# Patient Record
Sex: Female | Born: 1960 | Race: Black or African American | Hispanic: No | Marital: Single | Smoking: Former smoker
Health system: Southern US, Community
[De-identification: ages and names within clinical notes are randomized; demographics above are authoritative.]

## PROBLEM LIST (undated history)

## (undated) DIAGNOSIS — I10 Essential (primary) hypertension: Secondary | ICD-10-CM

## (undated) HISTORY — PX: APPENDECTOMY: SHX54

## (undated) HISTORY — PX: TONSILLECTOMY: SUR1361

---

## 2012-10-07 DIAGNOSIS — M79606 Pain in leg, unspecified: Secondary | ICD-10-CM | POA: Insufficient documentation

## 2013-06-28 ENCOUNTER — Emergency Department: Payer: Self-pay | Admitting: Internal Medicine

## 2013-06-28 LAB — COMPREHENSIVE METABOLIC PANEL
AST: 29 U/L (ref 15–37)
Albumin: 3.6 g/dL (ref 3.4–5.0)
Alkaline Phosphatase: 45 U/L
Anion Gap: 7 (ref 7–16)
BILIRUBIN TOTAL: 1 mg/dL (ref 0.2–1.0)
BUN: 17 mg/dL (ref 7–18)
CALCIUM: 8.5 mg/dL (ref 8.5–10.1)
CREATININE: 1.03 mg/dL (ref 0.60–1.30)
Chloride: 107 mmol/L (ref 98–107)
Co2: 23 mmol/L (ref 21–32)
EGFR (Non-African Amer.): >60
Glucose: 129 mg/dL — ABNORMAL HIGH (ref 65–99)
OSMOLALITY: 277 (ref 275–301)
POTASSIUM: 3.6 mmol/L (ref 3.5–5.1)
SGPT (ALT): 32 U/L (ref 12–78)
Sodium: 137 mmol/L (ref 136–145)
Total Protein: 7.5 g/dL (ref 6.4–8.2)

## 2013-06-28 LAB — URINALYSIS, COMPLETE
BACTERIA: NONE SEEN
Bilirubin,UR: NEGATIVE
Glucose,UR: 50 mg/dL (ref 0–75)
Ketone: NEGATIVE
Leukocyte Esterase: NEGATIVE
NITRITE: NEGATIVE
Ph: 6 (ref 4.5–8.0)
Protein: NEGATIVE
RBC,UR: 95 /HPF (ref 0–5)
SPECIFIC GRAVITY: 1.018 (ref 1.003–1.030)
Squamous Epithelial: 1

## 2013-06-28 LAB — CBC WITH DIFFERENTIAL/PLATELET
BASOS ABS: 0.1 10*3/uL (ref 0.0–0.1)
BASOS PCT: 0.8 %
EOS ABS: 0.1 10*3/uL (ref 0.0–0.7)
EOS PCT: 1.1 %
HCT: 39.1 % (ref 35.0–47.0)
HGB: 12.7 g/dL (ref 12.0–16.0)
LYMPHS PCT: 32.5 %
Lymphocyte #: 2.6 10*3/uL (ref 1.0–3.6)
MCH: 29.8 pg (ref 26.0–34.0)
MCHC: 32.4 g/dL (ref 32.0–36.0)
MCV: 92 fL (ref 80–100)
Monocyte #: 0.5 x10 3/mm (ref 0.2–0.9)
Monocyte %: 6.3 %
NEUTROS ABS: 4.7 10*3/uL (ref 1.4–6.5)
NEUTROS PCT: 59.3 %
Platelet: 243 10*3/uL (ref 150–440)
RBC: 4.26 10*6/uL (ref 3.80–5.20)
RDW: 12.8 % (ref 11.5–14.5)
WBC: 7.9 10*3/uL (ref 3.6–11.0)

## 2013-06-28 LAB — HCG, QUANTITATIVE, PREGNANCY: Beta Hcg, Quant.: <1 m[IU]/mL — ABNORMAL LOW

## 2013-09-14 ENCOUNTER — Ambulatory Visit: Payer: Self-pay | Admitting: General Practice

## 2014-12-04 ENCOUNTER — Emergency Department: Payer: 59

## 2014-12-04 ENCOUNTER — Inpatient Hospital Stay
Admission: EM | Admit: 2014-12-04 | Discharge: 2014-12-05 | DRG: 103 | Disposition: A | Discharge status: Home / Self Care | Payer: 59 | Attending: Internal Medicine | Admitting: Internal Medicine

## 2014-12-04 ENCOUNTER — Inpatient Hospital Stay
Admit: 2014-12-04 | Discharge: 2014-12-04 | Disposition: A | Discharge status: Home / Self Care | Payer: 59 | Attending: Internal Medicine | Admitting: Internal Medicine

## 2014-12-04 ENCOUNTER — Inpatient Hospital Stay: Payer: 59

## 2014-12-04 DIAGNOSIS — Z9889 Other specified postprocedural states: Secondary | ICD-10-CM

## 2014-12-04 DIAGNOSIS — G43809 Other migraine, not intractable, without status migrainosus: Secondary | ICD-10-CM | POA: Diagnosis not present

## 2014-12-04 DIAGNOSIS — R51 Headache: Secondary | ICD-10-CM

## 2014-12-04 DIAGNOSIS — Z888 Allergy status to other drugs, medicaments and biological substances status: Secondary | ICD-10-CM | POA: Diagnosis not present

## 2014-12-04 DIAGNOSIS — E785 Hyperlipidemia, unspecified: Secondary | ICD-10-CM | POA: Diagnosis present

## 2014-12-04 DIAGNOSIS — Z79899 Other long term (current) drug therapy: Secondary | ICD-10-CM | POA: Diagnosis not present

## 2014-12-04 DIAGNOSIS — I10 Essential (primary) hypertension: Secondary | ICD-10-CM | POA: Diagnosis present

## 2014-12-04 DIAGNOSIS — Z87891 Personal history of nicotine dependence: Secondary | ICD-10-CM

## 2014-12-04 DIAGNOSIS — I639 Cerebral infarction, unspecified: Secondary | ICD-10-CM | POA: Diagnosis not present

## 2014-12-04 DIAGNOSIS — Z8249 Family history of ischemic heart disease and other diseases of the circulatory system: Secondary | ICD-10-CM

## 2014-12-04 DIAGNOSIS — R29898 Other symptoms and signs involving the musculoskeletal system: Secondary | ICD-10-CM

## 2014-12-04 DIAGNOSIS — Z9049 Acquired absence of other specified parts of digestive tract: Secondary | ICD-10-CM | POA: Diagnosis present

## 2014-12-04 DIAGNOSIS — R519 Headache, unspecified: Secondary | ICD-10-CM

## 2014-12-04 HISTORY — DX: Essential (primary) hypertension: I10

## 2014-12-04 LAB — COMPREHENSIVE METABOLIC PANEL
ALT: 37 U/L (ref 14–54)
ANION GAP: 7 (ref 5–15)
AST: 37 U/L (ref 15–41)
Albumin: 4.3 g/dL (ref 3.5–5.0)
Alkaline Phosphatase: 46 U/L (ref 38–126)
BILIRUBIN TOTAL: 1.2 mg/dL (ref 0.3–1.2)
BUN: 20 mg/dL (ref 6–20)
CO2: 28 mmol/L (ref 22–32)
Calcium: 9.4 mg/dL (ref 8.9–10.3)
Chloride: 102 mmol/L (ref 101–111)
Creatinine, Ser: 1.08 mg/dL — ABNORMAL HIGH (ref 0.44–1.00)
GFR calc Af Amer: >60 mL/min (ref >60)
GFR, EST NON AFRICAN AMERICAN: 58 mL/min — AB (ref >60)
Glucose, Bld: 112 mg/dL — ABNORMAL HIGH (ref 65–99)
POTASSIUM: 3.7 mmol/L (ref 3.5–5.1)
Sodium: 137 mmol/L (ref 135–145)
TOTAL PROTEIN: 7.6 g/dL (ref 6.5–8.1)

## 2014-12-04 LAB — URINALYSIS COMPLETE WITH MICROSCOPIC (ARMC ONLY)
BILIRUBIN URINE: NEGATIVE
Bacteria, UA: NONE SEEN
Glucose, UA: NEGATIVE mg/dL
KETONES UR: NEGATIVE mg/dL
LEUKOCYTES UA: NEGATIVE
Nitrite: NEGATIVE
PH: 6 (ref 5.0–8.0)
Protein, ur: NEGATIVE mg/dL
Specific Gravity, Urine: 1.004 — ABNORMAL LOW (ref 1.005–1.030)

## 2014-12-04 LAB — CBC
HEMATOCRIT: 37.6 % (ref 35.0–47.0)
HEMOGLOBIN: 12.4 g/dL (ref 12.0–16.0)
MCH: 29.7 pg (ref 26.0–34.0)
MCHC: 33 g/dL (ref 32.0–36.0)
MCV: 90 fL (ref 80.0–100.0)
Platelets: 194 10*3/uL (ref 150–440)
RBC: 4.17 MIL/uL (ref 3.80–5.20)
RDW: 13.1 % (ref 11.5–14.5)
WBC: 7.6 10*3/uL (ref 3.6–11.0)

## 2014-12-04 LAB — DIFFERENTIAL
Basophils Absolute: 0 10*3/uL (ref 0–0.1)
Basophils Relative: 1 %
EOS ABS: 0.1 10*3/uL (ref 0–0.7)
EOS PCT: 1 %
LYMPHS ABS: 2.7 10*3/uL (ref 1.0–3.6)
Lymphocytes Relative: 36 %
MONO ABS: 0.7 10*3/uL (ref 0.2–0.9)
MONOS PCT: 10 %
Neutro Abs: 4 10*3/uL (ref 1.4–6.5)
Neutrophils Relative %: 52 %

## 2014-12-04 LAB — APTT: aPTT: 26 seconds (ref 24–36)

## 2014-12-04 LAB — TROPONIN I

## 2014-12-04 LAB — PROTIME-INR
INR: 0.96
Prothrombin Time: 13 seconds (ref 11.4–15.0)

## 2014-12-04 LAB — LIPID PANEL
CHOL/HDL RATIO: 3.6 ratio
CHOLESTEROL: 207 mg/dL — AB (ref 0–200)
HDL: 57 mg/dL (ref >40)
LDL Cholesterol: 142 mg/dL — ABNORMAL HIGH (ref 0–99)
TRIGLYCERIDES: 39 mg/dL (ref <150)
VLDL: 8 mg/dL (ref 0–40)

## 2014-12-04 MED ORDER — ADULT MULTIVITAMIN W/MINERALS CH: 1.0000 | ORAL_TABLET | Freq: Every day | ORAL | Status: DC

## 2014-12-04 MED ORDER — ENOXAPARIN SODIUM 40 MG/0.4ML ~~LOC~~ SOLN
40.0000 mg | SUBCUTANEOUS | Status: DC
  Administered 2014-12-04: 20:00:00 40 mg via SUBCUTANEOUS
  Filled 2014-12-04: qty 0.4

## 2014-12-04 MED ORDER — SIMVASTATIN 20 MG PO TABS
20.0000 mg | ORAL_TABLET | Freq: Every day | ORAL | Status: DC
Start: 2014-12-04 — End: 2014-12-05
  Administered 2014-12-04: 20 mg via ORAL
  Filled 2014-12-04: qty 1

## 2014-12-04 MED ORDER — ACETAMINOPHEN 650 MG RE SUPP: 650.0000 mg | Freq: Four times a day (QID) | RECTAL | Status: DC | PRN

## 2014-12-04 MED ORDER — ASPIRIN 325 MG PO TABS
325.0000 mg | ORAL_TABLET | Freq: Every day | ORAL | Status: DC
  Filled 2014-12-04 (×2): qty 1

## 2014-12-04 MED ORDER — MULTI-VITAMIN/MINERALS PO TABS: 1.0000 | ORAL_TABLET | Freq: Every day | ORAL | Status: DC

## 2014-12-04 MED ORDER — ASPIRIN 81 MG PO CHEW
324.0000 mg | CHEWABLE_TABLET | Freq: Once | ORAL | Status: AC
  Administered 2014-12-04: 324 mg via ORAL
  Filled 2014-12-04: qty 4

## 2014-12-04 MED ORDER — ACETAMINOPHEN 500 MG PO TABS
1000.0000 mg | ORAL_TABLET | Freq: Once | ORAL | Status: AC
  Administered 2014-12-04: 1000 mg via ORAL

## 2014-12-04 MED ORDER — ONDANSETRON HCL 4 MG PO TABS: 4.0000 mg | ORAL_TABLET | Freq: Four times a day (QID) | ORAL | Status: DC | PRN

## 2014-12-04 MED ORDER — ONDANSETRON HCL 4 MG/2ML IJ SOLN: 4.0000 mg | Freq: Four times a day (QID) | INTRAMUSCULAR | Status: DC | PRN

## 2014-12-04 MED ORDER — SODIUM CHLORIDE 0.9 % IV SOLN
INTRAVENOUS | Status: DC
  Administered 2014-12-04: 17:00:00 via INTRAVENOUS

## 2014-12-04 MED ORDER — SENNOSIDES-DOCUSATE SODIUM 8.6-50 MG PO TABS: 1.0000 | ORAL_TABLET | Freq: Every evening | ORAL | Status: DC | PRN

## 2014-12-04 MED ORDER — ATENOLOL 25 MG PO TABS
25.0000 mg | ORAL_TABLET | Freq: Every day | ORAL | Status: DC
  Filled 2014-12-04: qty 1

## 2014-12-04 MED ORDER — BUTALBITAL-APAP-CAFFEINE 50-325-40 MG PO TABS
1.0000 | ORAL_TABLET | ORAL | Status: DC | PRN
  Administered 2014-12-04: 1 via ORAL
  Filled 2014-12-04: qty 1

## 2014-12-04 MED ORDER — ACETAMINOPHEN 500 MG PO TABS
ORAL_TABLET | ORAL | Status: AC
  Administered 2014-12-04: 1000 mg via ORAL
  Filled 2014-12-04: qty 2

## 2014-12-04 MED ORDER — TRAMADOL HCL 50 MG PO TABS: 50.0000 mg | ORAL_TABLET | Freq: Two times a day (BID) | ORAL | Status: DC | PRN

## 2014-12-04 MED ORDER — ALUM & MAG HYDROXIDE-SIMETH 200-200-20 MG/5ML PO SUSP: 30.0000 mL | Freq: Four times a day (QID) | ORAL | Status: DC | PRN

## 2014-12-04 MED ORDER — METOCLOPRAMIDE HCL 5 MG/ML IJ SOLN
10.0000 mg | Freq: Once | INTRAMUSCULAR | Status: AC
Start: 2014-12-04 — End: 2014-12-04
  Administered 2014-12-04: 10 mg via INTRAVENOUS
  Filled 2014-12-04: qty 2

## 2014-12-04 MED ORDER — ACETAMINOPHEN 325 MG PO TABS: 650.0000 mg | ORAL_TABLET | Freq: Four times a day (QID) | ORAL | Status: DC | PRN

## 2014-12-04 NOTE — ED Provider Notes (Signed)
[facility] Emergency Department Provider Note  ____________________________________________  Time seen: 14  I have reviewed the triage vital signs and the nursing notes.   HISTORY  Chief Complaint Shortness of Breath  headache "Dragging left leg"    HPI Mariah Christian is a 54 y.o. female who worked overnight at a nursing care facility. She reports she was doing well yesterday. She woke up in the morning and went for her usual 3 mile walk. Later in the day she began to develop a headache. She took ibuprofen at around 5 PM and then prepared going to work.  At work, she continued to have a headache. She noticed that it would worsen with change in position. She specifically reports it hurt when she sat down and when she stood up. It also hurt if she bent over. She reports the headache was steady, not throbbing.  She had nausea with this. The nausea waxed and waned.   At 5 AM, she was up doing medication rounds. She noticed the left leg felt like it wasn't cooperating very well, as though it was "dragging".    The symptoms have continued through 6 AM. She started feeling worse and worse. She called one of the nurses who took her blood pressure. The blood pressure was okay but her heart rate was elevated at 126 while she was seated.  Family member in the room reports they noticed yesterday that the patient's heart rate, as measured by her fit that, was little high when she was resting and sitting. It was 102.  Patient denies having had symptoms like this before. She does describe some "shortness of breath", but is not having any difficulty breathing and denies any wheezing.        Past Medical History  Diagnosis Date  . Hypertension     There are no active problems to display for this patient.   Past Surgical History  Procedure Laterality Date  . Appendectomy    . Tonsillectomy      Current Outpatient Rx  Name  Route  Sig  Dispense  Refill   . atenolol (TENORMIN) 25 MG tablet   Oral   Take 1 tablet by mouth daily.         . fluticasone (FLONASE) 50 MCG/ACT nasal spray   Nasal   Place 2 sprays into the nose daily.         Marland Kitchen gabapentin (NEURONTIN) 300 MG capsule   Oral   Take 1 capsule by mouth daily as needed.         . Multiple Vitamins-Minerals (MULTIVITAMIN WITH MINERALS) tablet   Oral   Take 1 tablet by mouth daily.           Allergies Doxycycline  No family history on file.  Social History Social History  Substance Use Topics  . Smoking status: Former Research scientist (life sciences)  . Smokeless tobacco: Never Used  . Alcohol Use: Yes     Comment: occassionally    Review of Systems  Constitutional: Negative for fever. ENT: Negative for sore throat. Cardiovascular: Negative for chest pain. Respiratory: Negative for shortness of breath. Gastrointestinal: Negative for abdominal pain and diarrhea. Positive for nausea. Genitourinary: Negative for dysuria. Musculoskeletal: No myalgias or injuries. Skin: Negative for rash. Neurological: Positive for headaches. Notable for "left leg dragging"   10-point ROS otherwise negative.  ____________________________________________   PHYSICAL EXAM:  VITAL SIGNS: ED Triage Vitals  Enc Vitals Group     BP 12/04/14 0718 156/95 mmHg  Pulse Rate 12/04/14 0718 89     Resp 12/04/14 0718 17     Temp 12/04/14 0718 97.7 F (36.5 C)     Temp Source 12/04/14 0718 Oral     SpO2 12/04/14 0718 100 %     Weight 12/04/14 0718 180 lb (81.647 kg)     Height 12/04/14 0718 5\' 8"  (1.727 m)     Head Cir --      Peak Flow --      Pain Score 12/04/14 0720 6     Pain Loc --      Pain Edu? --      Excl. in [place]? --     Constitutional:  Alert and oriented. Well appearing and in no distress. ENT   Head: Normocephalic and atraumatic.   Nose: No congestion/rhinnorhea.   Mouth/Throat: Mucous membranes are moist. Cardiovascular: Normal rate at 92, regular rhythm, no murmur  noted Respiratory:  Normal respiratory effort, no tachypnea.    Breath sounds are clear and equal bilaterally.  Gastrointestinal: Soft and nontender. No distention.  Back: No muscle spasm, no tenderness, no CVA tenderness. Musculoskeletal: No deformity noted. Nontender with normal range of motion in all extremities.  No noted edema. Neurologic:  Normal speech and language. Grip strength is equal, though I expected a firmer grip from this strong appearing 54 year old female. No pronator drift. Good finger to nose, although the motion with the left hand and arm is a little bit slower and much more deliberate. 3-4 over 5 strength in the left leg and 4 over 5 strength in the left arm. The patient reports sensation is different in the left leg than the right leg. She reports it feels more tingly on the left. Skin:  Skin is warm, dry. No rash noted. Psychiatric: Mood and affect are normal. Speech and behavior are normal.  ____________________________________________    LABS (pertinent positives/negatives)  Labs Reviewed  COMPREHENSIVE METABOLIC PANEL - Abnormal; Notable for the following:    Glucose, Bld 112 (*)    Creatinine, Ser 1.08 (*)    GFR calc non Af Amer 58 (*)    All other components within normal limits  CBC  DIFFERENTIAL  TROPONIN I  PROTIME-INR  APTT  URINALYSIS COMPLETEWITH MICROSCOPIC ([facility] ONLY)     ____________________________________________   EKG  ED ECG REPORT I, Jarmal Lewelling W, the attending physician, personally viewed and interpreted this ECG.   Date: 12/04/2014  EKG Time: 7:47 AM  Rate: 110  Rhythm: Sinus tachycardia  Axis: Normal  Intervals: Normal  ST&T Change: None noted   ____________________________________________    RADIOLOGY  CT head  FINDINGS: The ventricles are normal in size and configuration. There is no intracranial mass hemorrhage, extra-axial fluid collection, or midline shift. Gray-white compartments are normal. No acute  infarct evident. Bony calvarium appears intact. The mastoid air cells are clear.  IMPRESSION: Study within normal limits. ____________________________________________   PROCEDURES  CRITICAL CARE Performed by: Ahmed Prima   Total critical care time: 30 minutes  Critical care time was exclusive of separately billable procedures and treating other patients.  Critical care was necessary to treat or prevent imminent or life-threatening deterioration.  Critical care was time spent personally by me on the following activities: development of treatment plan with patient and/or surrogate as well as nursing, discussions with consultants, evaluation of patient's response to treatment, examination of patient, obtaining history from patient or surrogate, ordering and performing treatments and interventions, ordering and review of laboratory studies, ordering and review of  radiographic studies, pulse oximetry and re-evaluation of patient's condition.   ____________________________________________   INITIAL IMPRESSION / ASSESSMENT AND PLAN / ED COURSE  Pertinent labs & imaging results that were available during my care of the patient were reviewed by me and considered in my medical decision making (see chart for details).  Initially, the patient was focused on what appeared to be a mild headache and some other mild symptoms. She looks well and appears well. She has notable weakness in the left leg on exam. I suspect the patient is having a CVA. We'll obtain a CT scan of the head and appropriate lab work.  ----------------------------------------- 9:19 AM on 12/04/2014 -----------------------------------------  CT head is negative. We will treat with aspirin at this time.  Reassessment of the patient finds her with slight improvement regarding the headache but ongoing weakness in the left leg. She has had additional episodes of feeling short of breath with a higher heart rate. Currently  her heart rate is in the 90s.  We will seek admission through the hospitalist service for this new onset focal weakness. This was discussed with Dr. Benjie Karvonen.   ____________________________________________   FINAL CLINICAL IMPRESSION(S) / ED DIAGNOSES  Final diagnoses:  Acute CVA (cerebrovascular accident)  Left leg weakness  Acute nonintractable headache, unspecified headache type    bv   Ahmed Prima, MD 12/04/14 [phone]

## 2014-12-04 NOTE — Progress Notes (Signed)
PT Cancellation Note  Patient Details Name: Mariah Christian MRN: [id number] DOB: 06-06-1960   Cancelled Treatment:    Reason Eval/Treat Not Completed: Patient at procedure or test/unavailable (Consult received and chart reviewed.  Patient currently off unit for diagnostic testing. Will re-attempt at later time/date as patient available and medically appropriate.)   Cecelia Graciano H. Owens Shark, PT, DPT, NCS 12/04/2014, 2:52 PM [phone]

## 2014-12-04 NOTE — Progress Notes (Signed)
*  PRELIMINARY RESULTS* Echocardiogram 2D Echocardiogram has been performed.  Mariah Christian 12/04/2014, 2:37 PM

## 2014-12-04 NOTE — ED Notes (Signed)
Pt taken to Echo.

## 2014-12-04 NOTE — ED Notes (Signed)
Pt comes into the ED via EMS from work at [employer], states before her shift last she had HA, states it progressed over the night and around 6am had nausea, diaphoresis and tightness in chest.

## 2014-12-04 NOTE — H&P (Signed)
[place] [facility] [place] NAME: Mariah Christian    MR#:  [id number]  DATE OF BIRTH:  11-06-1960  DATE OF ADMISSION:  12/04/2014  PRIMARY CARE PHYSICIAN: [facility]  REQUESTING/REFERRING PHYSICIAN: Dr Thomasene Lot  CHIEF COMPLAINT:  Tachycardia and weakness on the left side HISTORY OF PRESENT ILLNESS:  Mariah Christian  is a 54 y.o. female with a known history of hypertension who presents with above complaint. Patient has been in her usual state of health. Yesterday she woke up with a very severe headache. She took pain medications including ibuprofen and throughout the evening a continued on. She went to work and continued to have a headache however slightly improved. She noticed that the headache worsened with position. She reports the headache was not throbbing. At a proximal May 5 AM while she was doing her medication around she felt weakness on her left side, nauseous and her heart rate checked which was 126 and her blood pressure was normal. She presented to the emergency room with weakness on the left side.  PAST MEDICAL HISTORY:   Past Medical History  Diagnosis Date  . Hypertension     PAST SURGICAL HISTORY:   Past Surgical History  Procedure Laterality Date  . Appendectomy    . Tonsillectomy      SOCIAL HISTORY:   Social History  Substance Use Topics  . Smoking status: Former Research scientist (life sciences)  . Smokeless tobacco: Never Used  . Alcohol Use: Yes     Comment: occassionally    FAMILY HISTORY:  Hypertension  DRUG ALLERGIES:   Allergies  Allergen Reactions  . Doxycycline Shortness Of Breath     REVIEW OF SYSTEMS:  CONSTITUTIONAL: No fever, fatigue +++weakness.  EYES: No blurred or double vision.  EARS, NOSE, AND THROAT: No tinnitus or ear pain.  RESPIRATORY: No cough, shortness of breath, wheezing or hemoptysis.  CARDIOVASCULAR: No chest pain, orthopnea, edema.  GASTROINTESTINAL: No nausea, vomiting, diarrhea or abdominal pain.   GENITOURINARY: No dysuria, hematuria.  ENDOCRINE: No polyuria, nocturia,  HEMATOLOGY: No anemia, easy bruising or bleeding SKIN: No rash or lesion. MUSCULOSKELETAL: No joint pain or arthritis.   NEUROLOGIC: No tingling, numbness, +++weakness. ++Headache PSYCHIATRY: No anxiety or depression.   MEDICATIONS AT HOME:   Prior to Admission medications   Medication Sig Start Date End Date Taking? Authorizing Provider  atenolol (TENORMIN) 25 MG tablet Take 1 tablet by mouth daily. 12/26/13  Yes Historical Provider, MD  fluticasone (FLONASE) 50 MCG/ACT nasal spray Place 2 sprays into the nose daily. 10/22/14 10/22/15 Yes Historical Provider, MD  gabapentin (NEURONTIN) 300 MG capsule Take 1 capsule by mouth daily as needed. 12/26/13  Yes Historical Provider, MD  Multiple Vitamins-Minerals (MULTIVITAMIN WITH MINERALS) tablet Take 1 tablet by mouth daily.   Yes Historical Provider, MD      VITAL SIGNS:  Blood pressure 134/79, pulse 92, temperature 97.7 F (36.5 C), temperature source Oral, resp. rate 18, height 5\' 8"  (1.727 m), weight 81.647 kg (180 lb), SpO2 99 %.  PHYSICAL EXAMINATION:  GENERAL:  54 y.o.-year-old patient lying in the bed with no acute distress.  EYES: Pupils equal, round, reactive to light and accommodation. No scleral icterus. Extraocular muscles intact.  HEENT: Head atraumatic, normocephalic. Oropharynx and nasopharynx clear.  NECK:  Supple, no jugular venous distention. No thyroid enlargement, no tenderness.  LUNGS: Normal breath sounds bilaterally, no wheezing, rales,rhonchi or crepitation. No use of accessory muscles of respiration.  CARDIOVASCULAR: S1, S2 normal. No murmurs, rubs,  or gallops.  ABDOMEN: Soft, nontender, nondistended. Bowel sounds present. No organomegaly or mass.  EXTREMITIES: No pedal edema, cyanosis, or clubbing.  NEUROLOGIC: Cranial nerves II through XII are grossly intact. 4+/5 LLE PSYCHIATRIC: The patient is alert and oriented x 3.  SKIN: No obvious  rash, lesion, or ulcer.   LABORATORY PANEL:   CBC  Recent Labs Lab 12/04/14 0723  WBC 7.6  HGB 12.4  HCT 37.6  PLT 194   ------------------------------------------------------------------------------------------------------------------  Chemistries   Recent Labs Lab 12/04/14 0723  NA 137  K 3.7  CL 102  CO2 28  GLUCOSE 112*  BUN 20  CREATININE 1.08*  CALCIUM 9.4  AST 37  ALT 37  ALKPHOS 46  BILITOT 1.2   ------------------------------------------------------------------------------------------------------------------  Cardiac Enzymes  Recent Labs Lab 12/04/14 0723  TROPONINI <0.03   ------------------------------------------------------------------------------------------------------------------  RADIOLOGY:  Ct Head Wo Contrast  12/04/2014   CLINICAL DATA:  Left-sided weakness for several hours; headache  EXAM: CT HEAD WITHOUT CONTRAST  TECHNIQUE: Contiguous axial images were obtained from the base of the skull through the vertex without intravenous contrast.  COMPARISON:  None.  FINDINGS: The ventricles are normal in size and configuration. There is no intracranial mass hemorrhage, extra-axial fluid collection, or midline shift. Gray-white compartments are normal. No acute infarct evident. Bony calvarium appears intact. The mastoid air cells are clear.  IMPRESSION: Study within normal limits.   Electronically Signed   By: Lowella Grip III M.D.   On: 12/04/2014 08:12   US Carotid Bilateral  12/04/2014   CLINICAL DATA:  CVA. Severe headache. Left-sided weakness. Dizziness.  EXAM: BILATERAL CAROTID DUPLEX ULTRASOUND  TECHNIQUE: Pearline Cables scale imaging, color Doppler and duplex ultrasound were performed of bilateral carotid and vertebral arteries in the neck.  COMPARISON:  None.  FINDINGS: Criteria: Quantification of carotid stenosis is based on velocity parameters that correlate the residual internal carotid diameter with NASCET-based stenosis levels, using the diameter  of the distal internal carotid lumen as the denominator for stenosis measurement.  The following velocity measurements were obtained:  RIGHT  ICA:  91/38 cm/sec  CCA:  846/96 cm/sec  SYSTOLIC ICA/CCA RATIO:  0.8  DIASTOLIC ICA/CCA RATIO:  1.3  ECA:  97 cm/sec  LEFT  ICA:  125/41 cm/sec  CCA:  295/28 cm/sec  SYSTOLIC ICA/CCA RATIO:  1.0  DIASTOLIC ICA/CCA RATIO:  1.3  ECA:  139 cm/sec  RIGHT CAROTID ARTERY: Mild plaque right carotid bifurcation. No flow limiting stenosis.  RIGHT VERTEBRAL ARTERY:  Patent with antegrade flow.  LEFT CAROTID ARTERY: Mild plaque left carotid bifurcation. No flow limiting stenosis.  LEFT VERTEBRAL ARTERY:  Patent with antegrade flow.  IMPRESSION: 1. Mild bilateral carotid bifurcation atherosclerotic vascular plaque. No flow limiting stenosis. Degree of stenosis less than 50%. 2. Vertebral arteries are patent with antegrade flow.   Electronically Signed   By: Marcello Moores  Register   On: 12/04/2014 12:37    EKG:  No sinus rhythm no ST elevation depression  IMPRESSION AND PLAN:  54 year old female who has a history of hypertension who presents with elevated heart rate and left-sided weakness.  1. Left-sided weakness: Patient symptoms may be consistent with atypical migraine versus CVA. Patient will undergo stroke workup including MRI, carotid Dopplers and 2-D echocardiogram. I will also consult neurology. If she continues to have a headache I will try fierce that.  2. Headache: This is not a thunderclap headache. Headache is improving. Patient may have had a typical migraine. Neurology has been consulted.  3. Essential hypertension: Continue  atenolol.   All the records are reviewed and case discussed with ED provider. Management plans discussed with the patient and she is in agreement.  CODE STATUS: FULL  TOTAL TIME TAKING CARE OF THIS PATIENT: 45 minutes.    Francis Doenges M.D on 12/04/2014 at 2:00 PM  Between 7am to 6pm - Pager - [phone] After 6pm go to [email or web address]  - password [email or web address]  [facility]  Office  [phone]  CC: Primary care physician; No primary care provider on file.

## 2014-12-04 NOTE — ED Notes (Signed)
Patient is resting comfortably. 

## 2014-12-04 NOTE — Plan of Care (Signed)
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 CT and MRI negative for any infarcts. Awaiting a neurologist to see pt to assess what maybe causing this headache.  Goals to discharge: Pt consult Neurology consult NIH and stroke assessment q4h Assess bp for any spikes or trends LDL elevated, needs a statin at discharge Educate on a heart healthy diet, lifestyle change.

## 2014-12-05 MED ORDER — BUTALBITAL-APAP-CAFFEINE 50-325-40 MG PO TABS: 1.0000 | ORAL_TABLET | Freq: Four times a day (QID) | ORAL | Status: AC | PRN

## 2014-12-05 NOTE — Progress Notes (Signed)
Discharge and prescription reviewed with pt states understanding, no noted complaints at discharge

## 2014-12-05 NOTE — Discharge Instructions (Signed)
General Headache Without Cause A general headache is pain or discomfort felt around the head or neck area. The cause may not be found.  HOME CARE   Keep all doctor visits.  Only take medicines as told by your doctor.  Lie down in a dark, quiet room when you have a headache.  Keep a journal to find out if certain things bring on headaches. For example, write down:  What you eat and drink.  How much sleep you get.  Any change to your diet or medicines.  Relax by getting a massage or doing other relaxing activities.  Put ice or heat packs on the head and neck area as told by your doctor.  Lessen stress.  Sit up straight. Do not tighten (tense) your muscles.  Quit smoking if you smoke.  Lessen how much alcohol you drink.  Lessen how much caffeine you drink, or stop drinking caffeine.  Eat and sleep on a regular schedule.  Get 7 to 9 hours of sleep, or as told by your doctor.  Keep lights dim if bright lights bother you or make your headaches worse. GET HELP RIGHT AWAY IF:   Your headache becomes really bad.  You have a fever.  You have a stiff neck.  You have trouble seeing.  Your muscles are weak, or you lose muscle control.  You lose your balance or have trouble walking.  You feel like you will pass out (faint), or you pass out.  You have really bad symptoms that are different than your first symptoms.  You have problems with the medicines given to you by your doctor.  Your medicines do not work.  Your headache feels different than the other headaches.  You feel sick to your stomach (nauseous) or throw up (vomit). MAKE SURE YOU:   Understand these instructions.  Will watch your condition.  Will get help right away if you are not doing well or get worse. Document Released: 12/24/2007 Document Revised: 06/08/2011 Document Reviewed: 03/06/2011 [facility] Patient Information 2015 [place], [place]. This information is not intended to replace advice given to  you by your health care provider. Make sure you discuss any questions you have with your health care provider.  Migraine Headache A migraine headache is very bad, throbbing pain on one or both sides of your head. Talk to your doctor about what things may bring on (trigger) your migraine headaches. HOME CARE  Only take medicines as told by your doctor.  Lie down in a dark, quiet room when you have a migraine.  Keep a journal to find out if certain things bring on migraine headaches. For example, write down:  What you eat and drink.  How much sleep you get.  Any change to your diet or medicines.  Lessen how much alcohol you drink.  Quit smoking if you smoke.  Get enough sleep.  Lessen any stress in your life.  Keep lights dim if bright lights bother you or make your migraines worse. GET HELP RIGHT AWAY IF:   Your migraine becomes really bad.  You have a fever.  You have a stiff neck.  You have trouble seeing.  Your muscles are weak, or you lose muscle control.  You lose your balance or have trouble walking.  You feel like you will pass out (faint), or you pass out.  You have really bad symptoms that are different than your first symptoms. MAKE SURE YOU:   Understand these instructions.  Will watch your condition.  Will  get help right away if you are not doing well or get worse. Document Released: 12/24/2007 Document Revised: 06/08/2011 Document Reviewed: 11/21/2012 [facility] Patient Information 2015 [place], [place]. This information is not intended to replace advice given to you by your health care provider. Make sure you discuss any questions you have with your health care provider.  Headaches, Frequently Asked Questions MIGRAINE HEADACHES Q: What is migraine? What causes it? How can I treat it? A: Generally, migraine headaches begin as a dull ache. Then they develop into a constant, throbbing, and pulsating pain. You may experience pain at the temples. You may  experience pain at the front or back of one or both sides of the head. The pain is usually accompanied by a combination of:  Nausea.  Vomiting.  Sensitivity to light and noise. Some people (about 15%) experience an aura (see below) before an attack. The cause of migraine is believed to be chemical reactions in the brain. Treatment for migraine may include over-the-counter or prescription medications. It may also include self-help techniques. These include relaxation training and biofeedback.  Q: What is an aura? A: About 15% of people with migraine get an "aura". This is a sign of neurological symptoms that occur before a migraine headache. You may see wavy or jagged lines, dots, or flashing lights. You might experience tunnel vision or blind spots in one or both eyes. The aura can include visual or auditory hallucinations (something imagined). It may include disruptions in smell (such as strange odors), taste or touch. Other symptoms include:  Numbness.  A "pins and needles" sensation.  Difficulty in recalling or speaking the correct word. These neurological events may last as long as 60 minutes. These symptoms will fade as the headache begins. Q: What is a trigger? A: Certain physical or environmental factors can lead to or "trigger" a migraine. These include:  Foods.  Hormonal changes.  Weather.  Stress. It is important to remember that triggers are different for everyone. To help prevent migraine attacks, you need to figure out which triggers affect you. Keep a headache diary. This is a good way to track triggers. The diary will help you talk to your healthcare professional about your condition. Q: Does weather affect migraines? A: Bright sunshine, hot, humid conditions, and drastic changes in barometric pressure may lead to, or "trigger," a migraine attack in some people. But studies have shown that weather does not act as a trigger for everyone with migraines. Q: What is the link  between migraine and hormones? A: Hormones start and regulate many of your body's functions. Hormones keep your body in balance within a constantly changing environment. The levels of hormones in your body are unbalanced at times. Examples are during menstruation, pregnancy, or menopause. That can lead to a migraine attack. In fact, about three quarters of all women with migraine report that their attacks are related to the menstrual cycle.  Q: Is there an increased risk of stroke for migraine sufferers? A: The likelihood of a migraine attack causing a stroke is very remote. That is not to say that migraine sufferers cannot have a stroke associated with their migraines. In persons under age 53, the most common associated factor for stroke is migraine headache. But over the course of a person's normal life span, the occurrence of migraine headache may actually be associated with a reduced risk of dying from cerebrovascular disease due to stroke.  Q: What are acute medications for migraine? A: Acute medications are used to treat  the pain of the headache after it has started. Examples over-the-counter medications, NSAIDs, ergots, and triptans.  Q: What are the triptans? A: Triptans are the newest class of abortive medications. They are specifically targeted to treat migraine. Triptans are vasoconstrictors. They moderate some chemical reactions in the brain. The triptans work on receptors in your brain. Triptans help to restore the balance of a neurotransmitter called serotonin. Fluctuations in levels of serotonin are thought to be a main cause of migraine.  Q: Are over-the-counter medications for migraine effective? A: Over-the-counter, or "OTC," medications may be effective in relieving mild to moderate pain and associated symptoms of migraine. But you should see your caregiver before beginning any treatment regimen for migraine.  Q: What are preventive medications for migraine? A: Preventive medications  for migraine are sometimes referred to as "prophylactic" treatments. They are used to reduce the frequency, severity, and length of migraine attacks. Examples of preventive medications include antiepileptic medications, antidepressants, beta-blockers, calcium channel blockers, and NSAIDs (nonsteroidal anti-inflammatory drugs). Q: Why are anticonvulsants used to treat migraine? A: During the past few years, there has been an increased interest in antiepileptic drugs for the prevention of migraine. They are sometimes referred to as "anticonvulsants". Both epilepsy and migraine may be caused by similar reactions in the brain.  Q: Why are antidepressants used to treat migraine? A: Antidepressants are typically used to treat people with depression. They may reduce migraine frequency by regulating chemical levels, such as serotonin, in the brain.  Q: What alternative therapies are used to treat migraine? A: The term "alternative therapies" is often used to describe treatments considered outside the scope of conventional Western medicine. Examples of alternative therapy include acupuncture, acupressure, and yoga. Another common alternative treatment is herbal therapy. Some herbs are believed to relieve headache pain. Always discuss alternative therapies with your caregiver before proceeding. Some herbal products contain arsenic and other toxins. TENSION HEADACHES Q: What is a tension-type headache? What causes it? How can I treat it? A: Tension-type headaches occur randomly. They are often the result of temporary stress, anxiety, fatigue, or anger. Symptoms include soreness in your temples, a tightening band-like sensation around your head (a "vice-like" ache). Symptoms can also include a pulling feeling, pressure sensations, and contracting head and neck muscles. The headache begins in your forehead, temples, or the back of your head and neck. Treatment for tension-type headache may include over-the-counter or  prescription medications. Treatment may also include self-help techniques such as relaxation training and biofeedback. CLUSTER HEADACHES Q: What is a cluster headache? What causes it? How can I treat it? A: Cluster headache gets its name because the attacks come in groups. The pain arrives with little, if any, warning. It is usually on one side of the head. A tearing or bloodshot eye and a runny nose on the same side of the headache may also accompany the pain. Cluster headaches are believed to be caused by chemical reactions in the brain. They have been described as the most severe and intense of any headache type. Treatment for cluster headache includes prescription medication and oxygen. SINUS HEADACHES Q: What is a sinus headache? What causes it? How can I treat it? A: When a cavity in the bones of the face and skull (a sinus) becomes inflamed, the inflammation will cause localized pain. This condition is usually the result of an allergic reaction, a tumor, or an infection. If your headache is caused by a sinus blockage, such as an infection, you will probably have a  fever. An x-ray will confirm a sinus blockage. Your caregiver's treatment might include antibiotics for the infection, as well as antihistamines or decongestants.  REBOUND HEADACHES Q: What is a rebound headache? What causes it? How can I treat it? A: A pattern of taking acute headache medications too often can lead to a condition known as "rebound headache." A pattern of taking too much headache medication includes taking it more than 2 days per week or in excessive amounts. That means more than the label or a caregiver advises. With rebound headaches, your medications not only stop relieving pain, they actually begin to cause headaches. Doctors treat rebound headache by tapering the medication that is being overused. Sometimes your caregiver will gradually substitute a different type of treatment or medication. Stopping may be a  challenge. Regularly overusing a medication increases the potential for serious side effects. Consult a caregiver if you regularly use headache medications more than 2 days per week or more than the label advises. ADDITIONAL QUESTIONS AND ANSWERS Q: What is biofeedback? A: Biofeedback is a self-help treatment. Biofeedback uses special equipment to monitor your body's involuntary physical responses. Biofeedback monitors:  Breathing.  Pulse.  Heart rate.  Temperature.  Muscle tension.  Brain activity. Biofeedback helps you refine and perfect your relaxation exercises. You learn to control the physical responses that are related to stress. Once the technique has been mastered, you do not need the equipment any more. Q: Are headaches hereditary? A: Four out of five (80%) of people that suffer report a family history of migraine. Scientists are not sure if this is genetic or a family predisposition. Despite the uncertainty, a child has a 50% chance of having migraine if one parent suffers. The child has a 75% chance if both parents suffer.  Q: Can children get headaches? A: By the time they reach high school, most young people have experienced some type of headache. Many safe and effective approaches or medications can prevent a headache from occurring or stop it after it has begun.  Q: What type of doctor should I see to diagnose and treat my headache? A: Start with your primary caregiver. Discuss his or her experience and approach to headaches. Discuss methods of classification, diagnosis, and treatment. Your caregiver may decide to recommend you to a headache specialist, depending upon your symptoms or other physical conditions. Having diabetes, allergies, etc., may require a more comprehensive and inclusive approach to your headache. The National Headache Foundation will provide, upon request, a list of [facility] physician members in your state. Document Released: 06/06/2003 Document Revised:  06/08/2011 Document Reviewed: 11/14/2007 [facility] Patient Information 2015 [place], [place]. This information is not intended to replace advice given to you by your health care provider. Make sure you discuss any questions you have with your health care provider.

## 2014-12-05 NOTE — Plan of Care (Signed)
Problem: Discharge/Transitional Outcomes Goal: Other Discharge Outcomes/Goals Outcome: Progressing Plan of care progress to goal: VSS Pt up ad lib Complains of occasional headache

## 2014-12-05 NOTE — Discharge Summary (Signed)
[place] [facility] [place] NAME: Mariah Christian    MR#:  [id number]  DATE OF BIRTH:  [birth year]  DATE OF ADMISSION:  12/04/2014 ADMITTING PHYSICIAN: Bettey Costa, MD  DATE OF DISCHARGE:12/05/2014  PRIMARY CARE PHYSICIAN: [facility]   ADMISSION DIAGNOSIS:  CVA (cerebral infarction) [I63.9] Left leg weakness [R29.898] Acute CVA (cerebrovascular accident) [I63.9] Acute nonintractable headache, unspecified headache type [R51]  DISCHARGE DIAGNOSIS:  Active Problems:   Stroke   SECONDARY DIAGNOSIS:   Past Medical History  Diagnosis Date  . Hypertension     HOSPITAL COURSE:      DISCHARGE CONDITIONS AND DIET:  This is a very pleasant 54 year old female with a history of hypertension who presented with headache and weakness of her left side.  1. Left-sided weakness with headache: This is secondary to atypical migraine. She underwent stroke workup which essentially was negative. Fioricet did alleviate her headache and symptoms.  2. Essential hypertension: Patient will continue on atenolol.  3. Hyperlipidemia: Patient's LDL was elevated. She will need to speak with her primary care physician regarding this. She is encouraged to continue exercise.  CONSULTS OBTAINED:    DRUG ALLERGIES:   Allergies  Allergen Reactions  . Doxycycline Shortness Of Breath    DISCHARGE MEDICATIONS:   Current Discharge Medication List    START taking these medications   Details  butalbital-acetaminophen-caffeine (FIORICET) 50-325-40 MG per tablet Take 1-2 tablets by mouth every 6 (six) hours as needed for headache. Qty: 20 tablet, Refills: 0      CONTINUE these medications which have NOT CHANGED   Details  atenolol (TENORMIN) 25 MG tablet Take 1 tablet by mouth daily.    fluticasone (FLONASE) 50 MCG/ACT nasal spray Place 2 sprays into the nose daily.    gabapentin (NEURONTIN) 300 MG capsule Take 1 capsule by mouth daily as needed.    Multiple  Vitamins-Minerals (MULTIVITAMIN WITH MINERALS) tablet Take 1 tablet by mouth daily.              Today   CHIEF COMPLAINT:   patient is doing well this morning. Patient has no complaints.    VITAL SIGNS:  Blood pressure 136/83, pulse 110, temperature 97.8 F (36.6 C), temperature source Oral, resp. rate 16, height 5\' 8"  (1.727 m), weight 81.647 kg (180 lb), SpO2 98 %.   REVIEW OF SYSTEMS:  Review of Systems  Constitutional: Negative for fever, chills and malaise/fatigue.  HENT: Negative for sore throat.   Eyes: Negative for blurred vision.  Respiratory: Negative for cough, hemoptysis, shortness of breath and wheezing.   Cardiovascular: Negative for chest pain, palpitations and leg swelling.  Gastrointestinal: Negative for nausea, vomiting, abdominal pain, diarrhea and blood in stool.  Genitourinary: Negative for dysuria.  Musculoskeletal: Negative for back pain.  Neurological: Negative for dizziness, tremors, sensory change, speech change, focal weakness and headaches.  Endo/Heme/Allergies: Does not bruise/bleed easily.     PHYSICAL EXAMINATION:  GENERAL:  54 y.o.-year-old patient lying in the bed with no acute distress.  NECK:  Supple, no jugular venous distention. No thyroid enlargement, no tenderness.  LUNGS: Normal breath sounds bilaterally, no wheezing, rales,rhonchi  No use of accessory muscles of respiration.  CARDIOVASCULAR: S1, S2 normal. No murmurs, rubs, or gallops.  ABDOMEN: Soft, non-tender, non-distended. Bowel sounds present. No organomegaly or mass.  EXTREMITIES: No pedal edema, cyanosis, or clubbing.  PSYCHIATRIC: The patient is alert and oriented x 3.  SKIN: No obvious rash, lesion, or ulcer.   DATA REVIEW:  CBC  Recent Labs Lab 12/04/14 0723  WBC 7.6  HGB 12.4  HCT 37.6  PLT 194    Chemistries   Recent Labs Lab 12/04/14 0723  NA 137  K 3.7  CL 102  CO2 28  GLUCOSE 112*  BUN 20  CREATININE 1.08*  CALCIUM 9.4  AST 37  ALT 37   ALKPHOS 46  BILITOT 1.2    Cardiac Enzymes  Recent Labs Lab 12/04/14 0723  TROPONINI <0.03    Microbiology Results  @[email or web address] @  RADIOLOGY:  Ct Head Wo Contrast  12/04/2014   CLINICAL DATA:  Left-sided weakness for several hours; headache  EXAM: CT HEAD WITHOUT CONTRAST  TECHNIQUE: Contiguous axial images were obtained from the base of the skull through the vertex without intravenous contrast.  COMPARISON:  None.  FINDINGS: The ventricles are normal in size and configuration. There is no intracranial mass hemorrhage, extra-axial fluid collection, or midline shift. Gray-white compartments are normal. No acute infarct evident. Bony calvarium appears intact. The mastoid air cells are clear.  IMPRESSION: Study within normal limits.   Electronically Signed   By: Lowella Grip III M.D.   On: 12/04/2014 08:12   Mr Brain Wo Contrast  12/04/2014   CLINICAL DATA:  53 year old female with 1 day of weakness in the left side extremities associated with frontal headache. Initial encounter.  EXAM: MRI HEAD WITHOUT CONTRAST  TECHNIQUE: Multiplanar, multiecho pulse sequences of the brain and surrounding structures were obtained without intravenous contrast.  COMPARISON:  Head CT without contrast 0806 hours today.  FINDINGS: Cerebral volume is normal. No restricted diffusion to suggest acute infarction. No midline shift, mass effect, evidence of mass lesion, ventriculomegaly, extra-axial collection or acute intracranial hemorrhage. Cervicomedullary junction and pituitary are within normal limits. Major intracranial vascular flow voids are within normal limits. Pearline Cables and white matter signal is within normal limits throughout the brain. No chronic cerebral blood products.  Visible internal auditory structures appear normal. Mastoids are clear. Paranasal sinuses are clear. Orbit and scalp soft tissues are within normal limits. Negative visualized cervical spine. Normal bone marrow signal.  IMPRESSION: Normal non  contrast MRI appearance of the brain.   Electronically Signed   By: Genevie Ann M.D.   On: 12/04/2014 14:14   US Carotid Bilateral  12/04/2014   CLINICAL DATA:  CVA. Severe headache. Left-sided weakness. Dizziness.  EXAM: BILATERAL CAROTID DUPLEX ULTRASOUND  TECHNIQUE: Pearline Cables scale imaging, color Doppler and duplex ultrasound were performed of bilateral carotid and vertebral arteries in the neck.  COMPARISON:  None.  FINDINGS: Criteria: Quantification of carotid stenosis is based on velocity parameters that correlate the residual internal carotid diameter with NASCET-based stenosis levels, using the diameter of the distal internal carotid lumen as the denominator for stenosis measurement.  The following velocity measurements were obtained:  RIGHT  ICA:  91/38 cm/sec  CCA:  169/45 cm/sec  SYSTOLIC ICA/CCA RATIO:  0.8  DIASTOLIC ICA/CCA RATIO:  1.3  ECA:  97 cm/sec  LEFT  ICA:  125/41 cm/sec  CCA:  038/88 cm/sec  SYSTOLIC ICA/CCA RATIO:  1.0  DIASTOLIC ICA/CCA RATIO:  1.3  ECA:  139 cm/sec  RIGHT CAROTID ARTERY: Mild plaque right carotid bifurcation. No flow limiting stenosis.  RIGHT VERTEBRAL ARTERY:  Patent with antegrade flow.  LEFT CAROTID ARTERY: Mild plaque left carotid bifurcation. No flow limiting stenosis.  LEFT VERTEBRAL ARTERY:  Patent with antegrade flow.  IMPRESSION: 1. Mild bilateral carotid bifurcation atherosclerotic vascular plaque. No flow limiting stenosis. Degree of stenosis less than  50%. 2. Vertebral arteries are patent with antegrade flow.   Electronically Signed   By: Marcello Moores  Register   On: 12/04/2014 12:37      Management plans discussed with the patient and she is in agreement. Stable for discharge home  Patient should follow up with PCP in 1 week  CODE STATUS:     Code Status Orders        Start     Ordered   12/04/14 1123  Full code   Continuous     12/04/14 1122      TOTAL TIME TAKING CARE OF THIS PATIENT: 35 minutes.    Danzell Birky M.D on 12/05/2014 at 10:11  AM  Between 7am to 6pm - Pager - [phone] After 6pm go to [email or web address] - password [email or web address] [facility]  [facility]  Office  [phone]  CC: Primary care physician; No primary care provider on file.

## 2014-12-05 NOTE — Progress Notes (Signed)
PT Cancellation Note  Patient Details Name: Mariah Christian MRN: [id number] DOB: Nov 08, 1960   Cancelled Treatment:    Reason Eval/Treat Not Completed: Other (comment) (Per primary RN, patient up ad lib, independently ambulating in room and throughout unit without safety concern.  No skilled PT needs noted at this time.  Will complete initial order; please re-consult should needs change.)  [name] H. Owens Shark, PT, DPT, NCS 12/05/2014, 8:31 AM [phone]

## 2015-01-17 DIAGNOSIS — G43909 Migraine, unspecified, not intractable, without status migrainosus: Secondary | ICD-10-CM | POA: Insufficient documentation

## 2015-01-17 DIAGNOSIS — R531 Weakness: Secondary | ICD-10-CM | POA: Insufficient documentation

## 2015-07-05 ENCOUNTER — Encounter: Payer: Self-pay | Admitting: Physician Assistant

## 2015-07-05 ENCOUNTER — Emergency Department
Admission: EM | Admit: 2015-07-05 | Discharge: 2015-07-05 | Disposition: A | Discharge status: Home / Self Care | Payer: 59 | Attending: Emergency Medicine | Admitting: Emergency Medicine

## 2015-07-05 ENCOUNTER — Encounter: Payer: Self-pay | Admitting: Emergency Medicine

## 2015-07-05 ENCOUNTER — Emergency Department: Payer: 59

## 2015-07-05 ENCOUNTER — Ambulatory Visit: Payer: Self-pay | Admitting: Physician Assistant

## 2015-07-05 VITALS — BP 110/80 | HR 78 | Temp 98.2°F

## 2015-07-05 DIAGNOSIS — Z8673 Personal history of transient ischemic attack (TIA), and cerebral infarction without residual deficits: Secondary | ICD-10-CM | POA: Diagnosis not present

## 2015-07-05 DIAGNOSIS — R05 Cough: Secondary | ICD-10-CM | POA: Diagnosis not present

## 2015-07-05 DIAGNOSIS — I10 Essential (primary) hypertension: Secondary | ICD-10-CM | POA: Diagnosis not present

## 2015-07-05 DIAGNOSIS — Z79899 Other long term (current) drug therapy: Secondary | ICD-10-CM | POA: Diagnosis not present

## 2015-07-05 DIAGNOSIS — Z87891 Personal history of nicotine dependence: Secondary | ICD-10-CM | POA: Insufficient documentation

## 2015-07-05 DIAGNOSIS — R0789 Other chest pain: Secondary | ICD-10-CM | POA: Diagnosis not present

## 2015-07-05 DIAGNOSIS — R079 Chest pain, unspecified: Secondary | ICD-10-CM

## 2015-07-05 LAB — CBC WITH DIFFERENTIAL/PLATELET
BASOS PCT: 1 %
Basophils Absolute: 0.1 10*3/uL (ref 0–0.1)
EOS ABS: 0.2 10*3/uL (ref 0–0.7)
EOS PCT: 3 %
HCT: 39.2 % (ref 35.0–47.0)
Hemoglobin: 13 g/dL (ref 12.0–16.0)
LYMPHS ABS: 2.2 10*3/uL (ref 1.0–3.6)
Lymphocytes Relative: 35 %
MCH: 29.7 pg (ref 26.0–34.0)
MCHC: 33.1 g/dL (ref 32.0–36.0)
MCV: 89.6 fL (ref 80.0–100.0)
Monocytes Absolute: 0.5 10*3/uL (ref 0.2–0.9)
Monocytes Relative: 8 %
Neutro Abs: 3.4 10*3/uL (ref 1.4–6.5)
Neutrophils Relative %: 53 %
PLATELETS: 193 10*3/uL (ref 150–440)
RBC: 4.37 MIL/uL (ref 3.80–5.20)
RDW: 13.2 % (ref 11.5–14.5)
WBC: 6.3 10*3/uL (ref 3.6–11.0)

## 2015-07-05 LAB — COMPREHENSIVE METABOLIC PANEL
ALT: 24 U/L (ref 14–54)
ANION GAP: 5 (ref 5–15)
AST: 24 U/L (ref 15–41)
Albumin: 4.5 g/dL (ref 3.5–5.0)
Alkaline Phosphatase: 52 U/L (ref 38–126)
BUN: 17 mg/dL (ref 6–20)
CHLORIDE: 106 mmol/L (ref 101–111)
CO2: 26 mmol/L (ref 22–32)
CREATININE: 0.95 mg/dL (ref 0.44–1.00)
Calcium: 9 mg/dL (ref 8.9–10.3)
GFR calc non Af Amer: >60 mL/min (ref >60)
Glucose, Bld: 85 mg/dL (ref 65–99)
Potassium: 3.4 mmol/L — ABNORMAL LOW (ref 3.5–5.1)
SODIUM: 137 mmol/L (ref 135–145)
Total Bilirubin: 1 mg/dL (ref 0.3–1.2)
Total Protein: 7.9 g/dL (ref 6.5–8.1)

## 2015-07-05 LAB — FIBRIN DERIVATIVES D-DIMER (ARMC ONLY): FIBRIN DERIVATIVES D-DIMER (ARMC): 254 (ref 0–499)

## 2015-07-05 LAB — TROPONIN I: Troponin I: <0.03 ng/mL (ref <0.031)

## 2015-07-05 LAB — CK: CK TOTAL: 155 U/L (ref 38–234)

## 2015-07-05 MED ORDER — PREDNISONE 20 MG PO TABS
40.0000 mg | ORAL_TABLET | Freq: Once | ORAL | Status: AC
  Administered 2015-07-05: 40 mg via ORAL
  Filled 2015-07-05: qty 2

## 2015-07-05 MED ORDER — CYCLOBENZAPRINE HCL 10 MG PO TABS: 10.0000 mg | ORAL_TABLET | Freq: Three times a day (TID) | ORAL | Status: DC | PRN

## 2015-07-05 MED ORDER — PREDNISONE 20 MG PO TABS: 40.0000 mg | ORAL_TABLET | Freq: Every day | ORAL | Status: DC

## 2015-07-05 MED ORDER — IBUPROFEN 600 MG PO TABS
600.0000 mg | ORAL_TABLET | ORAL | Status: AC
  Administered 2015-07-05: 600 mg via ORAL
  Filled 2015-07-05: qty 1

## 2015-07-05 NOTE — Progress Notes (Signed)
S: c/o chest "pain" in center of chest and left upper back, states didn't think it was heart bc she didn't get nauseated, it didn't radiate to her jaw or down or her arm; sx started last weekend about 5-6 days ago, states she has pain in the back of her throat, some ear pain, no congestion or cough, doesn't feel like pleurisy which she has had before, ? If its just inflammation, pt is a former smoker, quit about 16 years ago, pmhx of htn and stroke, family hx of CAD; remainder ros neg  O: vitals wnl, nad, appears well, ENT wnl, neck supple no lymph, lungs c t a, cv rrr, trap muscles are spasmed, n/v intact, ekg shows diffuse twave changes  A: acute chest pain  P: sent pt to ER via courtesy car, escorted by rma, called charge nurse to notify of pt's condition, appears stable but needs to be evaluated due to symptoms and ekg

## 2015-07-05 NOTE — ED Notes (Signed)
Reports chest pain and cough x 1 wk. Cough makes pain worse.  Denies sob or nausea.

## 2015-07-05 NOTE — Discharge Instructions (Signed)
You have been seen in the Emergency Department (ED) today for back pain and muscle discomfort.  As we have discussed todays test results are reassuring, but you may require further testing.  Please follow up with the recommended doctor as instructed above in these documents regarding todays emergent visit and your recent symptoms to discuss further management.  Continue to take your regular medications. If you are not doing so already, please also take a daily baby aspirin (81 mg), at least until you follow up with your doctor.  Return to the Emergency Department (ED) if you experience any further chest pain/pressure/tightness, difficulty breathing, or sudden sweating, or other symptoms that concern you.   Chest Wall Pain Chest wall pain is pain in or around the bones and muscles of your chest. Sometimes, an injury causes this pain. Sometimes, the cause may not be known. This pain may take several weeks or longer to get better. HOME CARE INSTRUCTIONS  Pay attention to any changes in your symptoms. Take these actions to help with your pain:   Rest as told by your health care provider.   Avoid activities that cause pain. These include any activities that use your chest muscles or your abdominal and side muscles to lift heavy items.   If directed, apply ice to the painful area:  Put ice in a plastic bag.  Place a towel between your skin and the bag.  Leave the ice on for 20 minutes, 2-3 times per day.  Take over-the-counter and prescription medicines only as told by your health care provider.  Do not use tobacco products, including cigarettes, chewing tobacco, and e-cigarettes. If you need help quitting, ask your health care provider.  Keep all follow-up visits as told by your health care provider. This is important. SEEK MEDICAL CARE IF:  You have a fever.  Your chest pain becomes worse.  You have new symptoms. SEEK IMMEDIATE MEDICAL CARE IF:  You have nausea or  vomiting.  You feel sweaty or light-headed.  You have a cough with phlegm (sputum) or you cough up blood.  You develop shortness of breath.   This information is not intended to replace advice given to you by your health care provider. Make sure you discuss any questions you have with your health care provider.   Document Released: 03/16/2005 Document Revised: 12/05/2014 Document Reviewed: 06/11/2014 Elsevier Interactive Patient Education [employer].

## 2015-07-05 NOTE — ED Provider Notes (Signed)
[facility] Emergency Department Provider Note  ____________________________________________  Time seen: Approximately 4:06 PM  I have reviewed the triage vital signs and the nursing notes.   HISTORY  Chief Complaint Chest Pain    HPI Mariah Christian is a 55 y.o. female the history of hypertension.  Patient reports for about a week she's had discomfort across her back in the muscles around her upper back especially in the area of her left shoulder blade is worse with use of the left arm. She is also been experiencing a slight achiness and feels like the muscles of her upper chest in the front and back are a little tender. She has had a slight dry nonproductive cough for about a week and states that it sort of started with a scratchy throat, which has gotten better.  She is not nauseated, no sweating, denies any severe radiated "chest pain".  No long trips or travel, no use of estrogens, no leg swelling, no history of blood clots, no bloody cough, no pleuritic pain. No recent surgeries.  Past Medical History  Diagnosis Date  . Hypertension     Patient Active Problem List   Diagnosis Date Noted  . Stroke [facility]) 12/04/2014    Past Surgical History  Procedure Laterality Date  . Appendectomy    . Tonsillectomy      Current Outpatient Rx  Name  Route  Sig  Dispense  Refill  . atenolol (TENORMIN) 25 MG tablet   Oral   Take 1 tablet by mouth daily.         . butalbital-acetaminophen-caffeine (FIORICET) 50-325-40 MG per tablet   Oral   Take 1-2 tablets by mouth every 6 (six) hours as needed for headache.   20 tablet   0   . cyclobenzaprine (FLEXERIL) 10 MG tablet   Oral   Take 1 tablet (10 mg total) by mouth every 8 (eight) hours as needed for muscle spasms (do not drive while using, may make you drowsy).   20 tablet   0   . fluticasone (FLONASE) 50 MCG/ACT nasal spray   Nasal   Place 2 sprays into the nose daily.         Marland Kitchen gabapentin  (NEURONTIN) 300 MG capsule   Oral   Take 1 capsule by mouth daily as needed.         . Multiple Vitamins-Minerals (MULTIVITAMIN WITH MINERALS) tablet   Oral   Take 1 tablet by mouth daily.         . predniSONE (DELTASONE) 20 MG tablet   Oral   Take 2 tablets (40 mg total) by mouth daily.   10 tablet   0     Allergies Doxycycline  No family history on file.  Social History Social History  Substance Use Topics  . Smoking status: Former Research scientist (life sciences)  . Smokeless tobacco: Never Used  . Alcohol Use: Yes     Comment: occassionally    Review of Systems Constitutional: No fever/chills Eyes: No visual changes. ENT: See history of present illness Cardiovascular: See history of present illness Respiratory: Denies shortness of breath at present, though she did feel little winded the last few days. Gastrointestinal: No abdominal pain.  No nausea, no vomiting.  No diarrhea.  No constipation. Genitourinary: Negative for dysuria. Musculoskeletal: Negative for back pain except in the upper back primarily in the muscles around her spine and left shoulder. Skin: Negative for rash. Neurological: Negative for headaches, focal weakness or numbness.  10-point ROS  otherwise negative.  ____________________________________________   PHYSICAL EXAM:  VITAL SIGNS: ED Triage Vitals  Enc Vitals Group     BP 07/05/15 1439 144/75 mmHg     Pulse Rate 07/05/15 1439 63     Resp 07/05/15 1439 18     Temp 07/05/15 1439 97.3 F (36.3 C)     Temp Source 07/05/15 1439 Oral     SpO2 --      Weight 07/05/15 1439 190 lb (86.183 kg)     Height 07/05/15 1439 5\' 9"  (1.753 m)     Head Cir --      Peak Flow --      Pain Score 07/05/15 1440 5     Pain Loc --      Pain Edu? --      Excl. in [place]? --    Constitutional: Alert and oriented. Well appearing and in no acute distress. Very pleasant. Eyes: Conjunctivae are normal. PERRL. EOMI. Head: Atraumatic. Nose: No congestion/rhinnorhea. Mouth/Throat:  Mucous membranes are moist.  Oropharynx non-erythematous. Neck: No stridor.  No cervical tenderness. Patient does have tenderness of the paraspinous muscles and also along the trapezius of the left side. Cardiovascular: Normal rate, regular rhythm. Grossly normal heart sounds.  Good peripheral circulation. Respiratory: Normal respiratory effort.  No retractions. Lungs CTAB. Gastrointestinal: Soft and nontender. No distention.  Musculoskeletal: No lower extremity tenderness nor edema.  No joint effusions. Neurologic:  Normal speech and language. No gross focal neurologic deficits are appreciated. Skin:  Skin is warm, dry and intact. No rash noted. Psychiatric: Mood and affect are normal. Speech and behavior are normal.  ____________________________________________   LABS (all labs ordered are listed, but only abnormal results are displayed)  Labs Reviewed  COMPREHENSIVE METABOLIC PANEL - Abnormal; Notable for the following:    Potassium 3.4 (*)    All other components within normal limits  CBC WITH DIFFERENTIAL/PLATELET  TROPONIN I  CK  FIBRIN DERIVATIVES D-DIMER ([facility] ONLY)   ____________________________________________  EKG  Reviewed injury by me at 1440 Ventricular rate 70 150 QTc 420 Normal sinus rhythm, nonspecific T-wave abnormality is noted to Mrs. seen mostly in inferior and lateral leads.  In reviewing the patient's previous EKG, seems to have a similar appearance, and granted minimal changes in lead positioning probably I suspect no significant changes noted. ____________________________________________  RADIOLOGY  DG Chest 2 View (Final result) Result time: 07/05/15 16:57:58   Final result by Rad Results In Interface (07/05/15 16:57:58)   Narrative:   CLINICAL DATA: One week history of chest pain and cough.  EXAM: CHEST 2 VIEW  COMPARISON: None.  FINDINGS: The heart size and mediastinal contours are within normal limits. Both lungs are clear. The  visualized skeletal structures are unremarkable.  IMPRESSION: Normal chest x-ray.   Electronically Signed By: Marijo Sanes M.D. On: 07/05/2015 16:57       ____________________________________________   PROCEDURES  Procedure(s) performed: None  Critical Care performed: No  ____________________________________________   INITIAL IMPRESSION / ASSESSMENT AND PLAN / ED COURSE  Pertinent labs & imaging results that were available during my care of the patient were reviewed by me and considered in my medical decision making (see chart for details).  Patient presents for evaluation of back and upper chest discomfort, seems to have a primarily musculoskeletal type component to it. She does have focal tenderness along the paraspinous muscles and pain is worsened with use of the left upper arm. Her EKG has nonspecific abnormality, likely chronic in nature. Her troponin is  negative after 1 week of discomfort making acute coronary syndrome highly unlikely, and her presentation very atypical for ACS. We will exclude pulmonary embolism. D-dimer, obtain chest x-ray to evaluate for other causes such as pneumothorax.  ----------------------------------------- 6:05 PM on 07/05/2015 -----------------------------------------  Patient's lab tests very reassuring. Normal d-dimer, negative troponin. CK normal in assisting with evaluation for myositis which I do not detect. I suspect at this point likely musculoskeletal in nature, discussed with the patient very careful chest pain return precautions she'll follow closely with her doctor. We discussed risks and benefits of trial of muscle relaxants as well as prednisone, and we will trial prednisone as well as muscle relaxant over the next few days to see if any relief.  Patient awake alert nontoxic in no distress. Discharge home with careful return and follow-up.  Heart score low risk ( 3 )   ____________________________________________   FINAL CLINICAL IMPRESSION(S) / ED DIAGNOSES  Final diagnoses:  Chest pain, musculoskeletal      Delman Kitten, MD 07/05/15 1807

## 2015-07-10 ENCOUNTER — Emergency Department (HOSPITAL_COMMUNITY): Payer: 59

## 2015-07-10 ENCOUNTER — Emergency Department (HOSPITAL_COMMUNITY)
Admission: EM | Admit: 2015-07-10 | Discharge: 2015-07-10 | Disposition: A | Discharge status: Home / Self Care | Payer: 59 | Attending: Emergency Medicine | Admitting: Emergency Medicine

## 2015-07-10 ENCOUNTER — Encounter (HOSPITAL_COMMUNITY): Payer: Self-pay | Admitting: Emergency Medicine

## 2015-07-10 DIAGNOSIS — M545 Low back pain: Secondary | ICD-10-CM | POA: Diagnosis not present

## 2015-07-10 DIAGNOSIS — R0602 Shortness of breath: Secondary | ICD-10-CM | POA: Diagnosis not present

## 2015-07-10 DIAGNOSIS — Z87891 Personal history of nicotine dependence: Secondary | ICD-10-CM | POA: Insufficient documentation

## 2015-07-10 DIAGNOSIS — R079 Chest pain, unspecified: Secondary | ICD-10-CM | POA: Diagnosis not present

## 2015-07-10 DIAGNOSIS — M542 Cervicalgia: Secondary | ICD-10-CM | POA: Diagnosis not present

## 2015-07-10 DIAGNOSIS — Z7982 Long term (current) use of aspirin: Secondary | ICD-10-CM | POA: Insufficient documentation

## 2015-07-10 DIAGNOSIS — Z79899 Other long term (current) drug therapy: Secondary | ICD-10-CM | POA: Diagnosis not present

## 2015-07-10 DIAGNOSIS — R0789 Other chest pain: Secondary | ICD-10-CM | POA: Diagnosis not present

## 2015-07-10 DIAGNOSIS — I10 Essential (primary) hypertension: Secondary | ICD-10-CM | POA: Insufficient documentation

## 2015-07-10 LAB — CBC
HCT: 38.9 % (ref 36.0–46.0)
Hemoglobin: 13 g/dL (ref 12.0–15.0)
MCH: 29.5 pg (ref 26.0–34.0)
MCHC: 33.4 g/dL (ref 30.0–36.0)
MCV: 88.2 fL (ref 78.0–100.0)
PLATELETS: 238 10*3/uL (ref 150–400)
RBC: 4.41 MIL/uL (ref 3.87–5.11)
RDW: 12.9 % (ref 11.5–15.5)
WBC: 11.7 10*3/uL — ABNORMAL HIGH (ref 4.0–10.5)

## 2015-07-10 LAB — BASIC METABOLIC PANEL
Anion gap: 10 (ref 5–15)
BUN: 24 mg/dL — AB (ref 6–20)
CHLORIDE: 105 mmol/L (ref 101–111)
CO2: 25 mmol/L (ref 22–32)
CREATININE: 1 mg/dL (ref 0.44–1.00)
Calcium: 9.3 mg/dL (ref 8.9–10.3)
GFR calc Af Amer: >60 mL/min (ref >60)
GFR calc non Af Amer: >60 mL/min (ref >60)
Glucose, Bld: 97 mg/dL (ref 65–99)
Potassium: 3.7 mmol/L (ref 3.5–5.1)
SODIUM: 140 mmol/L (ref 135–145)

## 2015-07-10 LAB — I-STAT TROPONIN, ED: Troponin i, poc: 0 ng/mL (ref 0.00–0.08)

## 2015-07-10 MED ORDER — HYDROCODONE-ACETAMINOPHEN 5-325 MG PO TABS
1.0000 | ORAL_TABLET | Freq: Once | ORAL | Status: AC
  Administered 2015-07-10: 1 via ORAL
  Filled 2015-07-10: qty 1

## 2015-07-10 MED ORDER — NAPROXEN 500 MG PO TABS: 500.0000 mg | ORAL_TABLET | Freq: Two times a day (BID) | ORAL | Status: AC

## 2015-07-10 MED ORDER — HYDROCODONE-ACETAMINOPHEN 5-325 MG PO TABS: 1.0000 | ORAL_TABLET | Freq: Four times a day (QID) | ORAL | Status: DC | PRN

## 2015-07-10 MED ORDER — KETOROLAC TROMETHAMINE 30 MG/ML IJ SOLN
30.0000 mg | Freq: Once | INTRAMUSCULAR | Status: AC
  Administered 2015-07-10: 30 mg via INTRAVENOUS
  Filled 2015-07-10: qty 1

## 2015-07-10 MED ORDER — DIAZEPAM 5 MG PO TABS
5.0000 mg | ORAL_TABLET | Freq: Once | ORAL | Status: AC
  Administered 2015-07-10: 5 mg via ORAL
  Filled 2015-07-10: qty 1

## 2015-07-10 MED ORDER — OXYCODONE-ACETAMINOPHEN 5-325 MG PO TABS: 1.0000 | ORAL_TABLET | Freq: Once | ORAL | Status: DC

## 2015-07-10 NOTE — ED Notes (Signed)
Pt. reports central chest pain radiating to upper back and posterior neck onset last week with SOB and nausea .

## 2015-07-10 NOTE — ED Provider Notes (Signed)
CSN: [id number]     Arrival date & time 07/10/15  0119 History   By signing my name below, I, Mariah Christian, attest that this documentation has been prepared under the direction and in the presence of Mariah Hacker, MD.  Electronically Signed: Forrestine Christian, ED Scribe. 07/10/2015. 3:01 AM.   Chief Complaint  Patient presents with  . Chest Pain   The history is provided by the patient. No language interpreter was used.    HPI Comments: Mariah Christian is a 55 y.o. female with a PMHx of HTN who presents to the Emergency Department complaining of intermittent, ongoing central chest pain that radiates to the upper back and posterior neck x 1 week. Pain is described as pressure. Discomfort is exacerbated with certain movements without any alleviating factors. She denies any recent heavy lifting. Ms. Mariah Christian was previously evaluated at [facility] for same on 4/7. At that time, she was discharged home with prescriptions for Prednisone and Flexeril. However, she states these medications are no longer working for her. No recent fever or chills. Pt with known allergy to Doxycyline.   Patient was evaluated [place] [facility]. Had a negative troponin, reassuring EKG, negative d-dimer.  PCP: Mariah Slipper, MD    Past Medical History  Diagnosis Date  . Hypertension    Past Surgical History  Procedure Laterality Date  . Appendectomy    . Tonsillectomy     No family history on file. Social History  Substance Use Topics  . Smoking status: Former Research scientist (life sciences)  . Smokeless tobacco: Never Used  . Alcohol Use: Yes     Comment: occassionally   OB History    No data available     Review of Systems  Constitutional: Negative for fever and chills.  Respiratory: Positive for shortness of breath.   Cardiovascular: Positive for chest pain.  Gastrointestinal: Negative for vomiting.  Musculoskeletal: Positive for back pain and neck pain.  Neurological: Negative for headaches.   Psychiatric/Behavioral: Negative for confusion.  All other systems reviewed and are negative.     Allergies  Doxycycline  Home Medications   Prior to Admission medications   Medication Sig Start Date End Date Taking? Authorizing Provider  aspirin 81 MG chewable tablet Chew 81 mg by mouth daily.   Yes Historical Provider, MD  atenolol (TENORMIN) 25 MG tablet Take 1 tablet by mouth daily. 12/26/13  Yes Historical Provider, MD  gabapentin (NEURONTIN) 300 MG capsule Take 1 capsule by mouth daily as needed. 12/26/13  Yes Historical Provider, MD  Multiple Vitamins-Minerals (MULTIVITAMIN WITH MINERALS) tablet Take 1 tablet by mouth daily.   Yes Historical Provider, MD  butalbital-acetaminophen-caffeine (FIORICET) 50-325-40 MG per tablet Take 1-2 tablets by mouth every 6 (six) hours as needed for headache. Patient not taking: Reported on 07/10/2015 12/05/14 12/05/15  Mariah Costa, MD  cyclobenzaprine (FLEXERIL) 10 MG tablet Take 1 tablet (10 mg total) by mouth every 8 (eight) hours as needed for muscle spasms (do not drive while using, may make you drowsy). Patient not taking: Reported on 07/10/2015 07/05/15   Mariah Kitten, MD  HYDROcodone-acetaminophen (NORCO/VICODIN) 5-325 MG tablet Take 1 tablet by mouth every 6 (six) hours as needed for moderate pain. 07/10/15   Mariah Hacker, MD  naproxen (NAPROSYN) 500 MG tablet Take 1 tablet (500 mg total) by mouth 2 (two) times daily. 07/10/15   Mariah Hacker, MD  predniSONE (DELTASONE) 20 MG tablet Take 2 tablets (40 mg total) by mouth daily. Patient not taking: Reported on 07/10/2015  07/05/15   Mariah Kitten, MD   Triage Vitals: BP 122/67 mmHg  Pulse 62  Temp(Src) 98 F (36.7 C) (Oral)  Resp 16  SpO2 100%   Physical Exam  Constitutional: She is oriented to person, place, and time. She appears well-developed and well-nourished. No distress.  HENT:  Head: Normocephalic and atraumatic.  Neck: Normal range of motion.  Cardiovascular: Normal rate, regular  rhythm and normal heart sounds.   No murmur heard. Pulmonary/Chest: Effort normal and breath sounds normal. No respiratory distress. She has no wheezes. She exhibits tenderness.  Tenderness palpation along the chest wall  Abdominal: Soft. Bowel sounds are normal. There is no tenderness. There is no rebound.  Musculoskeletal: She exhibits no edema.  Tenderness to palpation bilateral paraspinous muscle region of the lower cervical and upper thoracic spine, no midline step-offs or deformities  Neurological: She is alert and oriented to person, place, and time.  Skin: Skin is warm and dry.  Psychiatric: She has a normal mood and affect.  Nursing note and vitals reviewed.   ED Course  Procedures (including critical care time)  DIAGNOSTIC STUDIES: Oxygen Saturation is 99% on RA, Normal by my interpretation.    COORDINATION OF CARE: 2:59 AM- Will order CXR, EKG, and blood work. Discussed treatment plan with pt at bedside and pt agreed to plan.     Labs Review Labs Reviewed  BASIC METABOLIC PANEL - Abnormal; Notable for the following:    BUN 24 (*)    All other components within normal limits  CBC - Abnormal; Notable for the following:    WBC 11.7 (*)    All other components within normal limits  I-STAT TROPOININ, ED    Imaging Review Dg Chest 2 View  07/10/2015  CLINICAL DATA:  Midchest pain and to the upper mid back for 1 week EXAM: CHEST  2 VIEW COMPARISON:  07/05/2015 FINDINGS: There is minimal linear atelectatic appearing opacity in the left base. The lungs are otherwise clear. The pulmonary vasculature is normal. Hilar, mediastinal and cardiac contours are unremarkable and unchanged. Severe chronic left shoulder arthropathy incidentally noted. IMPRESSION: Mild atelectatic appearing left base opacities. Electronically Signed   By: Mariah Christian M.D.   On: 07/10/2015 02:05   I have personally reviewed and evaluated these images and lab results as part of my medical  decision-making.   EKG Interpretation   Date/Time:  Wednesday July 10 2015 01:25:19 EDT Ventricular Rate:  68 PR Interval:  140 QRS Duration: 80 QT Interval:  374 QTC Calculation: 397 R Axis:   67 Text Interpretation:  Normal sinus rhythm T wave abnormality, consider  inferior ischemia T wave abnormality, consider anterolateral ischemia  Abnormal ECG Similar T wave changes when compared to prior Confirmed by  Hoda Hon  MD, [place] ([id number]) on 07/10/2015 2:28:50 AM      MDM   Final diagnoses:  Other chest pain    Patient presents with chest pain and back pain. Ongoing for one week. Very atypical for ACS. EKG is similar to prior. Troponin is negative. Given one week of symptoms, feel this is unlikely to be ACS.  Pain is also reproducible on exam and improved temporarily with NSAIDs. Patient was given Valium and Toradol. Chest x-ray is reassuring and basic labwork is reassuring. On recheck, she continues to endorse some pain. She was additionally given Norco. Discussed at length with the patient that given the nature and reproducibility ascending, feel this is still consistent with musculoskeletal skeletal pain. Continue anti-inflammatories at home.  We'll give a short course of Norco. Follow-up with primary physician if symptoms persist.  After history, exam, and medical workup I feel the patient has been appropriately medically screened and is safe for discharge home. Pertinent diagnoses were discussed with the patient. Patient was given return precautions.   I personally performed the services described in this documentation, which was scribed in my presence. The recorded information has been reviewed and is accurate.   Mariah Hacker, MD 07/10/15 [phone]

## 2015-07-10 NOTE — Discharge Instructions (Signed)

## 2015-12-30 DIAGNOSIS — N951 Menopausal and female climacteric states: Secondary | ICD-10-CM | POA: Diagnosis not present

## 2015-12-30 DIAGNOSIS — Z0000 Encounter for general adult medical examination without abnormal findings: Secondary | ICD-10-CM | POA: Diagnosis not present

## 2015-12-30 DIAGNOSIS — I10 Essential (primary) hypertension: Secondary | ICD-10-CM | POA: Diagnosis not present

## 2015-12-30 DIAGNOSIS — Z114 Encounter for screening for human immunodeficiency virus [HIV]: Secondary | ICD-10-CM | POA: Diagnosis not present

## 2015-12-30 DIAGNOSIS — M25531 Pain in right wrist: Secondary | ICD-10-CM | POA: Diagnosis not present

## 2015-12-30 DIAGNOSIS — Z1159 Encounter for screening for other viral diseases: Secondary | ICD-10-CM | POA: Diagnosis not present

## 2015-12-30 DIAGNOSIS — Z13220 Encounter for screening for lipoid disorders: Secondary | ICD-10-CM | POA: Diagnosis not present

## 2015-12-30 DIAGNOSIS — G43109 Migraine with aura, not intractable, without status migrainosus: Secondary | ICD-10-CM | POA: Diagnosis not present

## 2015-12-30 DIAGNOSIS — Z23 Encounter for immunization: Secondary | ICD-10-CM | POA: Diagnosis not present

## 2016-01-02 DIAGNOSIS — R768 Other specified abnormal immunological findings in serum: Secondary | ICD-10-CM | POA: Diagnosis not present

## 2016-01-07 DIAGNOSIS — B182 Chronic viral hepatitis C: Secondary | ICD-10-CM | POA: Insufficient documentation

## 2016-01-17 DIAGNOSIS — B182 Chronic viral hepatitis C: Secondary | ICD-10-CM | POA: Diagnosis not present

## 2016-01-30 DIAGNOSIS — B182 Chronic viral hepatitis C: Secondary | ICD-10-CM | POA: Diagnosis not present

## 2016-03-19 ENCOUNTER — Encounter: Payer: Self-pay | Admitting: *Deleted

## 2016-03-19 ENCOUNTER — Ambulatory Visit (INDEPENDENT_AMBULATORY_CARE_PROVIDER_SITE_OTHER): Payer: 59 | Admitting: Internal Medicine

## 2016-03-19 ENCOUNTER — Encounter: Payer: Self-pay | Admitting: Internal Medicine

## 2016-03-19 VITALS — BP 151/91 | HR 59 | Temp 97.5°F | Ht 68.5 in | Wt 185.0 lb

## 2016-03-19 DIAGNOSIS — Z862 Personal history of diseases of the blood and blood-forming organs and certain disorders involving the immune mechanism: Secondary | ICD-10-CM | POA: Insufficient documentation

## 2016-03-19 DIAGNOSIS — B182 Chronic viral hepatitis C: Secondary | ICD-10-CM | POA: Diagnosis not present

## 2016-03-19 DIAGNOSIS — I10 Essential (primary) hypertension: Secondary | ICD-10-CM | POA: Insufficient documentation

## 2016-03-19 DIAGNOSIS — D219 Benign neoplasm of connective and other soft tissue, unspecified: Secondary | ICD-10-CM | POA: Insufficient documentation

## 2016-03-19 DIAGNOSIS — K219 Gastro-esophageal reflux disease without esophagitis: Secondary | ICD-10-CM | POA: Insufficient documentation

## 2016-03-19 DIAGNOSIS — N83209 Unspecified ovarian cyst, unspecified side: Secondary | ICD-10-CM | POA: Insufficient documentation

## 2016-03-19 MED ORDER — LEDIPASVIR-SOFOSBUVIR 90-400 MG PO TABS: 1.0000 | ORAL_TABLET | Freq: Every day | ORAL | 2 refills | Status: AC

## 2016-03-19 MED FILL — HARVONI 90-400 MG TABLET: 90-400 | 28 days supply | Qty: 28 | Fill #0

## 2016-03-19 NOTE — Patient Instructions (Signed)
Date 03/19/16  Dear Mariah Christian, As discussed in the [facility], your hepatitis C therapy will include the following medications:          Harvoni 90mg /400mg  tablet:           Take 1 tablet by mouth once daily   Please note that ALL MEDICATIONS WILL START ON THE SAME DATE for a total of 12 weeks. ---------------------------------------------------------------- Your HCV Treatment Start Date: TBA   Your HCV genotype:  1a   ---------------------------------------------------------------- YOUR PHARMACY CONTACT:   [facility] Phone: [phone] Hours: Monday to Friday 7:30 am to 6:00 pm   Please always contact your pharmacy at least 3-4 business days before you run out of medications to ensure your next month's medication is ready or 1 week prior to running out if you receive it by mail.  Remember, each prescription is for 28 days. ---------------------------------------------------------------- GENERAL NOTES REGARDING YOUR HEPATITIS C MEDICATION:  SOFOSBUVIR/LEDIPASVIR (HARVONI): - Harvoni tablet is taken daily with OR without food. - The tablets are orange. - The tablets should be stored at room temperature.  - Acid reducing agents such as H2 blockers (ie. Pepcid (famotidine), Zantac (ranitidine), Tagamet (cimetidine), Axid (nizatidine) and proton pump inhibitors (ie. Prilosec (omeprazole), Protonix (pantoprazole), Nexium (esomeprazole), or Aciphex (rabeprazole)) can decrease effectiveness of Harvoni. Do not take until you have discussed with a health care provider.    -Antacids that contain magnesium and/or aluminum hydroxide (ie. Milk of Magensia, Rolaids, Gaviscon, Maalox, Mylanta, an dArthritis Pain Formula)can reduce absorption of Harvoni, so take them at least 4 hours before or after Harvoni.  -Calcium carbonate (calcium supplements or antacids such as Tums, Caltrate, Os-Cal)needs to be taken at  least 4 hours hours before or after Harvoni.  -St. John's wort or any products that contain St. John's wort like some herbal supplements  Please inform the office prior to starting any of these medications.  - The common side effects associated with Harvoni include:      1. Fatigue      2. Headache      3. Nausea      4. Diarrhea      5. Insomnia  Please note that this only lists the most common side effects and is NOT a comprehensive list of the potential side effects of these medications. For more information, please review the drug information sheets that come with your medication package from the pharmacy.  ---------------------------------------------------------------- GENERAL HELPFUL HINTS ON HCV THERAPY: 1. Stay well-hydrated. 2. Notify the [facility] of any changes in your other over-the-counter/herbal or prescription medications. 3. If you miss a dose of your medication, take the missed dose as soon as you remember. Return to your regular time/dose schedule the next day.  4.  Do not stop taking your medications without first talking with your healthcare provider. 5.  You may take Tylenol (acetaminophen), as long as the dose is less than 2000 mg (OR no more than 4 tablets of the Tylenol Extra Strengths 500mg  tablet) in 24 hours. 6.  You will see our pharmacist-specialist within the first 2 weeks of starting your medication to monitor for any possible side effects. 7.  You will have labs once during treatment, after soon after treatment completion and one final lab 6 months after treatment completion to verify the virus is out of your system.  Scharlene Gloss, [place] [facility] [address] [place], [place]  [address] [phone]

## 2016-03-19 NOTE — Progress Notes (Signed)
[facility]   CC: consideration for treatment for chronic hepatitis C  HPI:  +Mariah Christian is a 55 y.o. female who presents for initial evaluation and management of chronic hepatitis C.  Patient tested positive earlier this year during routine screening. Hepatitis C-associated risk factors present are: none. Patient denies history of blood transfusion, intranasal drug use, IV drug abuse, sexual contact with person with liver disease, tattoos. Patient has had other studies performed. Results: hepatitis C RNA by PCR, result: positive. Patient has not had prior treatment for Hepatitis C. Patient does not have a past history of liver disease. Patient does not have a family history of liver disease. Patient does not  have associated signs or symptoms related to liver disease.  Labs reviewed and confirm chronic hepatitis C with a positive viral load.   Records reviewed from [facility] and followed there as well.  Genotype 1a,  Had Fibroscan she thinks but no result in Care Everywhere.      Patient does not have documented immunity to Hepatitis A. Patient does have documented immunity to Hepatitis B.    Review of Systems:   Constitutional: negative for fatigue, malaise and anorexia Gastrointestinal: negative for diarrhea Integument/breast: negative for rash All other systems reviewed and are negative       Past Medical History:  Diagnosis Date  . Hypertension     Prior to Admission medications   Medication Sig Start Date End Date Taking? Authorizing Provider  aspirin 81 MG chewable tablet Chew 81 mg by mouth daily.   Yes Historical Provider, MD  atenolol (TENORMIN) 50 MG tablet Take by mouth. 12/30/15  Yes Historical Provider, MD  diazepam (VALIUM) 5 MG tablet Take 1 tablet by mouth every 6 (six) hours as needed. 12/30/15  Yes Historical Provider, MD  fluticasone (FLONASE) 50 MCG/ACT nasal spray Place into the nose. 12/30/15 12/29/16 Yes Historical Provider, MD    gabapentin (NEURONTIN) 300 MG capsule Take by mouth. 12/30/15  Yes Historical Provider, MD  meloxicam (MOBIC) 7.5 MG tablet Take 1 tablet by mouth 2 (two) times daily as needed. 12/30/15 12/29/16 Yes Historical Provider, MD  Multiple Vitamins-Minerals (MULTIVITAMIN WITH MINERALS) tablet Take 1 tablet by mouth daily.   Yes Historical Provider, MD  naproxen (NAPROSYN) 500 MG tablet Take 1 tablet (500 mg total) by mouth 2 (two) times daily. Patient taking differently: Take 500 mg by mouth as needed.  07/10/15  Yes [name], MD  Ledipasvir-Sofosbuvir (HARVONI) 90-400 MG TABS Take 1 tablet by mouth daily. 03/19/16   Thayer Headings, MD    Allergies  Allergen Reactions  . Doxycycline Shortness Of Breath    Social History  Substance Use Topics  . Smoking status: Former Research scientist (life sciences)  . Smokeless tobacco: Never Used  . Alcohol use Yes     Comment: occassionally    Riley: no cirrhosis, no liver cancre   Objective:  Constitutional: in no apparent distress and alert,  Vitals:   03/19/16 0933  BP: (!) 151/91  Pulse: (!) 59  Temp: 97.5 F (36.4 C)   Eyes: anicteric Cardiovascular: Cor RRR Respiratory: CTA B; normal respiratory effort Gastrointestinal: Bowel sounds are normal, liver is not enlarged, spleen is not enlarged Musculoskeletal: no pedal edema noted Skin: negatives: no rash; no porphyria cutanea tarda Lymphatic: no cervical lymphadenopathy   Laboratory Genotype: No results found for: HCVGENOTYPE HCV viral load: No results found for: HCVQUANT Lab Results  Component Value Date   WBC 11.7 (H) 07/10/2015  HGB 13.0 07/10/2015   HCT 38.9 07/10/2015   MCV 88.2 07/10/2015   PLT 238 07/10/2015    Lab Results  Component Value Date   CREATININE 1.00 07/10/2015   BUN 24 (H) 07/10/2015   NA 140 07/10/2015   K 3.7 07/10/2015   CL 105 07/10/2015   CO2 25 07/10/2015    Lab Results  Component Value Date   ALT 24 07/05/2015   AST 24 07/05/2015   ALKPHOS 52 07/05/2015      Labs and history reviewed and show CHILD-PUGH A  5-6 points: Child class A 7-9 points: Child class B 10-15 points: Child class C  Lab Results  Component Value Date   INR 0.96 12/04/2014   BILITOT 1.0 07/05/2015   ALBUMIN 4.5 07/05/2015     Assessment: New Patient with Chronic Hepatitis C genotype 1a, untreated.  I discussed with the patient the lab findings that confirm chronic hepatitis C as well as the natural history and progression of disease including about 30% of people who develop cirrhosis of the liver if left untreated and once cirrhosis is established there is a 2-7% risk per year of liver cancer and liver failure.  I discussed the importance of treatment and benefits in reducing the risk, even if significant liver fibrosis exists.   Plan: 1) Patient counseled extensively on limiting acetaminophen to no more than 2 grams daily, avoidance of alcohol. 2) Transmission discussed with patient including sexual transmission, sharing razors and toothbrush.   3) Will need referral to gastroenterology if concern for cirrhosis 4) Will need referral for substance abuse counseling: No.; Further work up to include urine drug screen  No. 5) Will prescribe Harvoni for 12 weeks She will continue to follow up with [facility] rtc prn

## 2016-04-09 MED FILL — HARVONI 90-400 MG TABLET: 90-400 | 28 days supply | Qty: 28 | Fill #1

## 2016-05-19 MED FILL — HARVONI 90-400 MG TABLET: 90-400 | 28 days supply | Qty: 28 | Fill #2

## 2016-11-16 ENCOUNTER — Encounter: Payer: Self-pay | Admitting: Emergency Medicine

## 2016-11-16 ENCOUNTER — Emergency Department
Admission: EM | Admit: 2016-11-16 | Discharge: 2016-11-16 | Disposition: A | Discharge status: Home / Self Care | Payer: PRIVATE HEALTH INSURANCE | Attending: Emergency Medicine | Admitting: Emergency Medicine

## 2016-11-16 DIAGNOSIS — Z7982 Long term (current) use of aspirin: Secondary | ICD-10-CM | POA: Diagnosis not present

## 2016-11-16 DIAGNOSIS — M545 Low back pain: Secondary | ICD-10-CM | POA: Diagnosis present

## 2016-11-16 DIAGNOSIS — M5431 Sciatica, right side: Secondary | ICD-10-CM | POA: Diagnosis not present

## 2016-11-16 DIAGNOSIS — I10 Essential (primary) hypertension: Secondary | ICD-10-CM | POA: Diagnosis not present

## 2016-11-16 DIAGNOSIS — Z87891 Personal history of nicotine dependence: Secondary | ICD-10-CM | POA: Insufficient documentation

## 2016-11-16 DIAGNOSIS — Z79899 Other long term (current) drug therapy: Secondary | ICD-10-CM | POA: Insufficient documentation

## 2016-11-16 MED ORDER — METHYLPREDNISOLONE SODIUM SUCC 125 MG IJ SOLR
125.0000 mg | Freq: Once | INTRAMUSCULAR | Status: AC
  Administered 2016-11-16: 125 mg via INTRAMUSCULAR
  Filled 2016-11-16: qty 2

## 2016-11-16 MED ORDER — PREDNISONE 10 MG (21) PO TBPK: ORAL_TABLET | ORAL | 0 refills | Status: DC

## 2016-11-16 MED ORDER — KETOROLAC TROMETHAMINE 60 MG/2ML IM SOLN
30.0000 mg | Freq: Once | INTRAMUSCULAR | Status: AC
  Administered 2016-11-16: 30 mg via INTRAMUSCULAR
  Filled 2016-11-16: qty 2

## 2016-11-16 NOTE — ED Notes (Signed)
ADON: Mariah Christian called at [phone], stated she wanted an UDS performed.

## 2016-11-16 NOTE — ED Provider Notes (Signed)
[facility] Emergency Department Provider Note  ____________________________________________  Time seen: Approximately 9:41 PM  I have reviewed the triage vital signs and the nursing notes.   HISTORY  Chief Complaint Back Pain    HPI Mariah Christian is a 56 y.o. female that presents to the emergency department with low back pain that radiates into right leg for one day. Patient states that she was bending over at work into her locker when her back "gave out." She states that her back is not painful to touch. It is painful when she is moving between a sitting and standing position. She has not had sciatica for 4 years.No bowel or bladder dysfunction or saddle paresthesias. She has used Neurontin in the past, which worked. No shortness breath, chest pain, nausea, vomiting, abdominal pain, dysuria, urgency, frequency.   Past Medical History:  Diagnosis Date  . Hypertension     Patient Active Problem List   Diagnosis Date Noted  . Fibroids 03/19/2016  . History of anemia 03/19/2016  . GERD (gastroesophageal reflux disease) 03/19/2016  . Hypertension 03/19/2016  . Ovarian cyst 03/19/2016  . Chronic hepatitis C without hepatic coma ([place]) 01/07/2016  . Migraine 01/17/2015  . Weakness of left side of body 01/17/2015  . Stroke ([place]) 12/04/2014  . Leg pain 10/07/2012    Past Surgical History:  Procedure Laterality Date  . APPENDECTOMY    . TONSILLECTOMY      Prior to Admission medications   Medication Sig Start Date End Date Taking? Authorizing Provider  aspirin 81 MG chewable tablet Chew 81 mg by mouth daily.    [provider]  atenolol (TENORMIN) 50 MG tablet Take by mouth. 12/30/15   [provider]  diazepam (VALIUM) 5 MG tablet Take 1 tablet by mouth every 6 (six) hours as needed. 12/30/15   [provider]  fluticasone (FLONASE) 50 MCG/ACT nasal spray Place into the nose. 12/30/15 12/29/16  [provider]   gabapentin (NEURONTIN) 300 MG capsule Take by mouth. 12/30/15   [provider]  Ledipasvir-Sofosbuvir (HARVONI) 90-400 MG TABS Take 1 tablet by mouth daily. 03/19/16   Comer, Okey Regal, MD  meloxicam (MOBIC) 7.5 MG tablet Take 1 tablet by mouth 2 (two) times daily as needed. 12/30/15 12/29/16  [provider]  Multiple Vitamins-Minerals (MULTIVITAMIN WITH MINERALS) tablet Take 1 tablet by mouth daily.    [provider]  naproxen (NAPROSYN) 500 MG tablet Take 1 tablet (500 mg total) by mouth 2 (two) times daily. Patient taking differently: Take 500 mg by mouth as needed.  07/10/15   Horton, Barbette Hair, MD  predniSONE (STERAPRED UNI-PAK 21 TAB) 10 MG (21) TBPK tablet Take 6 tablets on day 1, take 5 tablets on day 2, take 4 tablets on day 3, take 3 tablets on day 4, take 2 tablets on day 5, take 1 tablet on day 6 11/16/16   Laban Emperor, PA-C    Allergies Doxycycline  No family history on file.  Social History Social History  Substance Use Topics  . Smoking status: Former Research scientist (life sciences)  . Smokeless tobacco: Never Used  . Alcohol use Yes     Comment: occassionally     Review of Systems  Constitutional: No fever/chills Cardiovascular: No chest pain. Respiratory: No SOB. Gastrointestinal: No abdominal pain.  No nausea, no vomiting.  Musculoskeletal: Positive for back pain. Skin: Negative for rash, abrasions, lacerations, ecchymosis. Neurological: Negative for headaches, numbness or tingling   ____________________________________________   PHYSICAL EXAM:  VITAL SIGNS: ED Triage Vitals  Enc Vitals Group     BP 11/16/16 1942 133/73     Pulse Rate 11/16/16 1942 79     Resp 11/16/16 1942 18     Temp 11/16/16 1942 98 F (36.7 C)     Temp Source 11/16/16 1942 Oral     SpO2 11/16/16 1942 98 %     Weight 11/16/16 1940 175 lb (79.4 kg)     Height 11/16/16 1940 5\' 8"  (1.727 m)     Head Circumference --      Peak Flow --      Pain Score 11/16/16 1940 8     Pain  Loc --      Pain Edu? --      Excl. in [place]? --      Constitutional: Alert and oriented. Well appearing and in no acute distress. Eyes: Conjunctivae are normal. PERRL. EOMI. Head: Atraumatic. ENT:      Ears:      Nose: No congestion/rhinnorhea.      Mouth/Throat: Mucous membranes are moist.  Neck: No stridor. Cardiovascular: Normal rate, regular rhythm.  Good peripheral circulation. Respiratory: Normal respiratory effort without tachypnea or retractions. Lungs CTAB. Good air entry to the bases with no decreased or absent breath sounds. Gastrointestinal: Bowel sounds 4 quadrants. Soft and nontender to palpation. No guarding or rigidity. No palpable masses. No distention. No CVA tenderness. Musculoskeletal: Full range of motion to all extremities. No gross deformities appreciated. No tenderness to palpation.Positive straight leg raise. Neurologic:  Normal speech and language. No gross focal neurologic deficits are appreciated.  Skin:  Skin is warm, dry and intact. No rash noted.   ____________________________________________   LABS (all labs ordered are listed, but only abnormal results are displayed)  Labs Reviewed - No data to display ____________________________________________  EKG   ____________________________________________  RADIOLOGY   No results found.  ____________________________________________    PROCEDURES  Procedure(s) performed:    Procedures    Medications  ketorolac (TORADOL) injection 30 mg (30 mg Intramuscular Given 11/16/16 2202)  methylPREDNISolone sodium succinate (SOLU-MEDROL) 125 mg/2 mL injection 125 mg (125 mg Intramuscular Given 11/16/16 2202)     ____________________________________________   INITIAL IMPRESSION / ASSESSMENT AND PLAN / ED COURSE  Pertinent labs & imaging results that were available during my care of the patient were reviewed by me and considered in my medical decision making (see chart for details).  Review of  the [place] CSRS was performed in accordance of the [place] prior to dispensing any controlled drugs.   She presented to the emergency department for evaluation of low back pain. Symptoms are consistent with sciatica. Vital signs and exam are reassuring. No bowel or bladder dysfunction or saddle paresthesias. She was given IM Toradol and Solu-Medrol in ED. Patient will be discharged home with prescriptions for prednisone. Patient is to follow up with PCP as directed. Patient is given ED precautions to return to the ED for any worsening or new symptoms.     ____________________________________________  FINAL CLINICAL IMPRESSION(S) / ED DIAGNOSES  Final diagnoses:  Sciatica of right side      NEW MEDICATIONS STARTED DURING THIS VISIT:  Discharge Medication List as of 11/16/2016 10:19 PM    START taking these medications   Details  predniSONE (STERAPRED UNI-PAK 21 TAB) 10 MG (21) TBPK tablet Take 6 tablets on day 1, take 5 tablets on day 2, take 4 tablets on day 3, take 3 tablets on day 4, take 2 tablets  on day 5, take 1 tablet on day 6, Print            This chart was dictated using voice recognition software/Dragon. Despite best efforts to proofread, errors can occur which can change the meaning. Any change was purely unintentional.    Laban Emperor, PA-C 11/16/16 2325    Clearnce Hasten Randall An, MD 11/17/16 [phone]

## 2016-11-16 NOTE — ED Triage Notes (Signed)
Pt to triage via w/c without no distress noted; st bent over into locker and felt back pain; c/o lower back pain radiating into right leg; st hx of same

## 2016-11-24 DIAGNOSIS — I10 Essential (primary) hypertension: Secondary | ICD-10-CM | POA: Diagnosis not present

## 2016-11-24 DIAGNOSIS — M5431 Sciatica, right side: Secondary | ICD-10-CM | POA: Diagnosis not present

## 2016-11-24 DIAGNOSIS — G43109 Migraine with aura, not intractable, without status migrainosus: Secondary | ICD-10-CM | POA: Diagnosis not present

## 2016-11-24 DIAGNOSIS — B182 Chronic viral hepatitis C: Secondary | ICD-10-CM | POA: Diagnosis not present

## 2017-01-12 DIAGNOSIS — I10 Essential (primary) hypertension: Secondary | ICD-10-CM | POA: Diagnosis not present

## 2017-01-12 DIAGNOSIS — M5431 Sciatica, right side: Secondary | ICD-10-CM | POA: Diagnosis not present

## 2017-01-12 DIAGNOSIS — B182 Chronic viral hepatitis C: Secondary | ICD-10-CM | POA: Diagnosis not present

## 2017-01-12 DIAGNOSIS — Z13220 Encounter for screening for lipoid disorders: Secondary | ICD-10-CM | POA: Diagnosis not present

## 2017-01-12 DIAGNOSIS — Z0000 Encounter for general adult medical examination without abnormal findings: Secondary | ICD-10-CM | POA: Diagnosis not present

## 2017-02-24 DIAGNOSIS — Z1231 Encounter for screening mammogram for malignant neoplasm of breast: Secondary | ICD-10-CM | POA: Diagnosis not present

## 2017-04-20 IMAGING — CR DG CHEST 2V
2 series · 2 of 2 positions shown · non-contrast
Comparison: None.

CLINICAL DATA: One week history of chest pain and cough.

EXAM:
CHEST  2 VIEW

[chest pa]
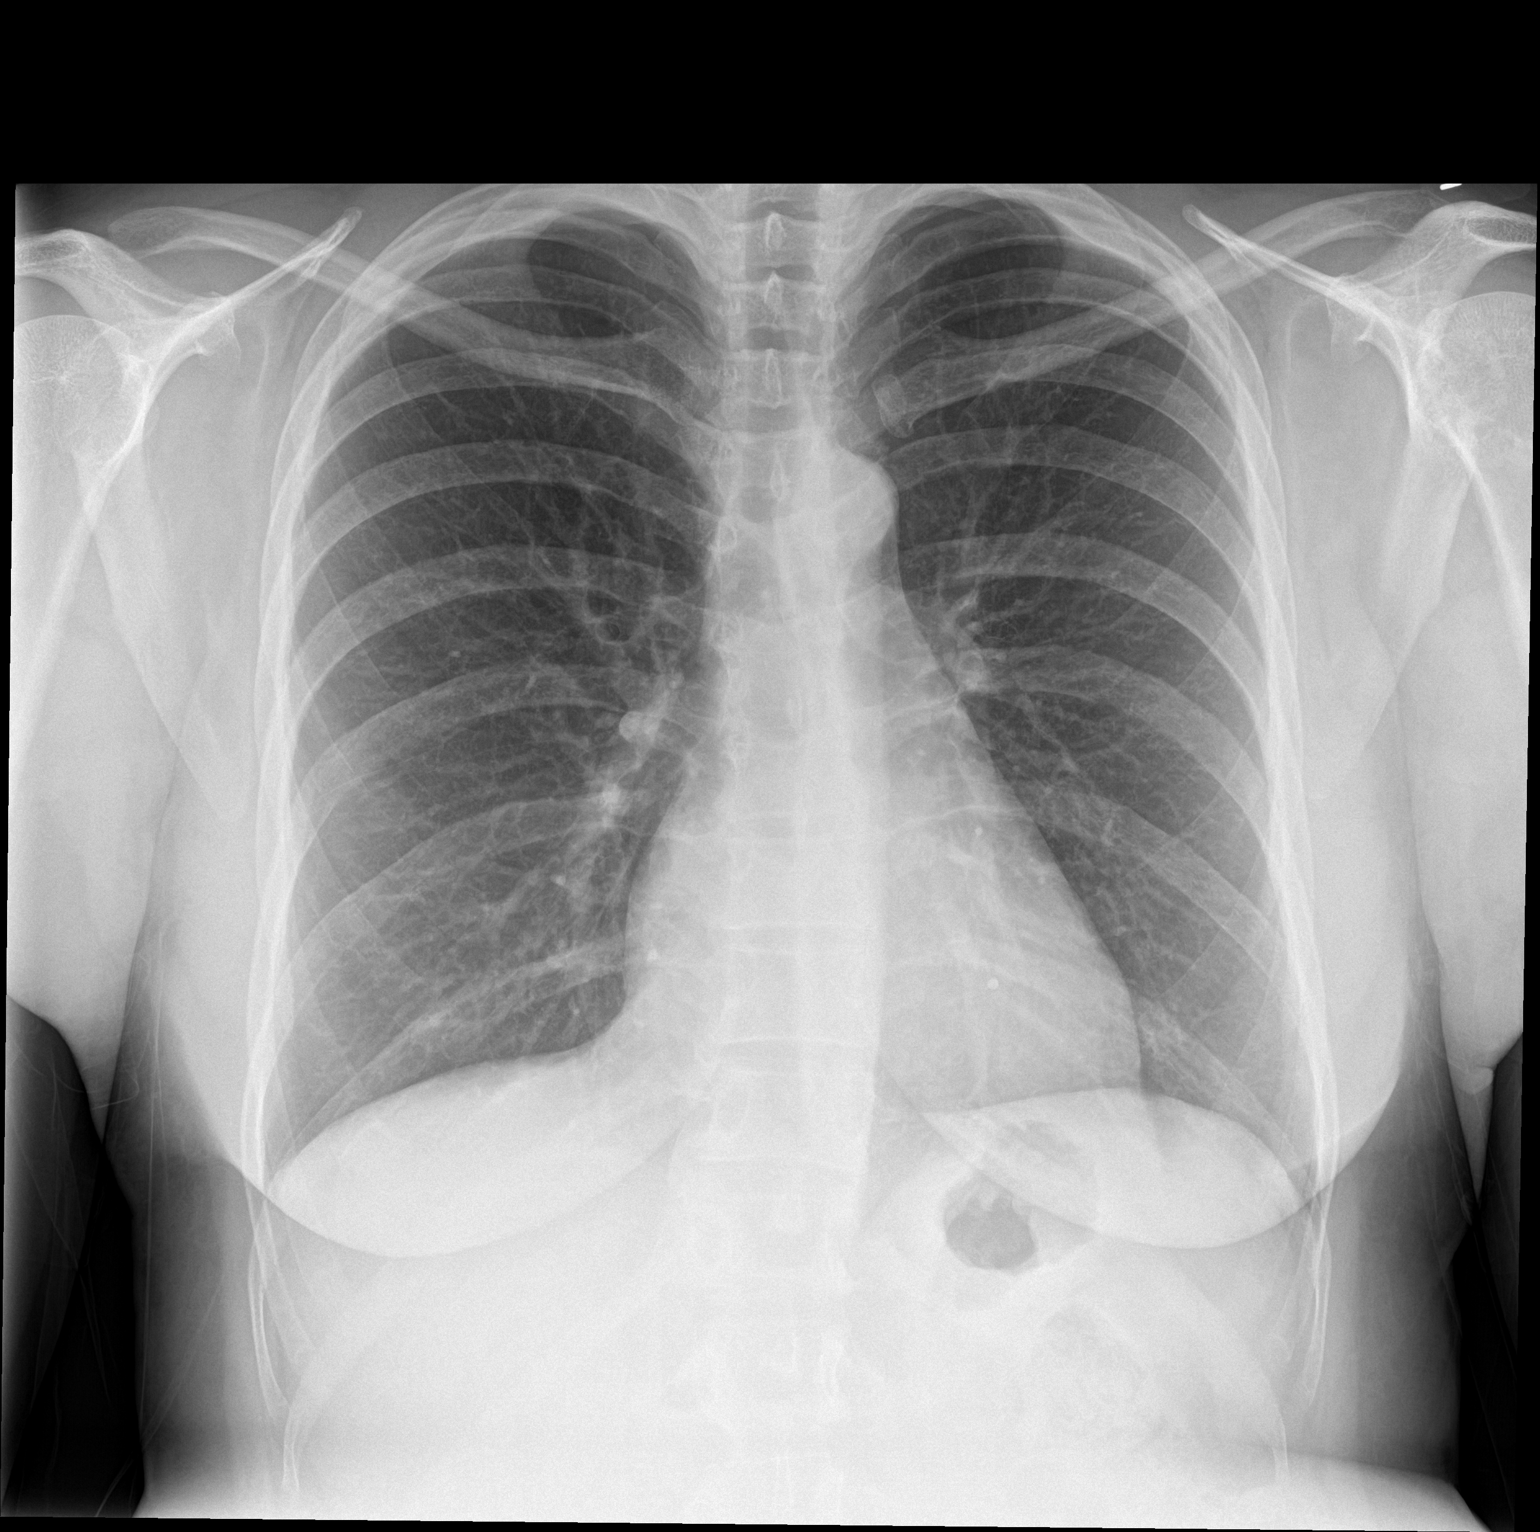

[chest lat]
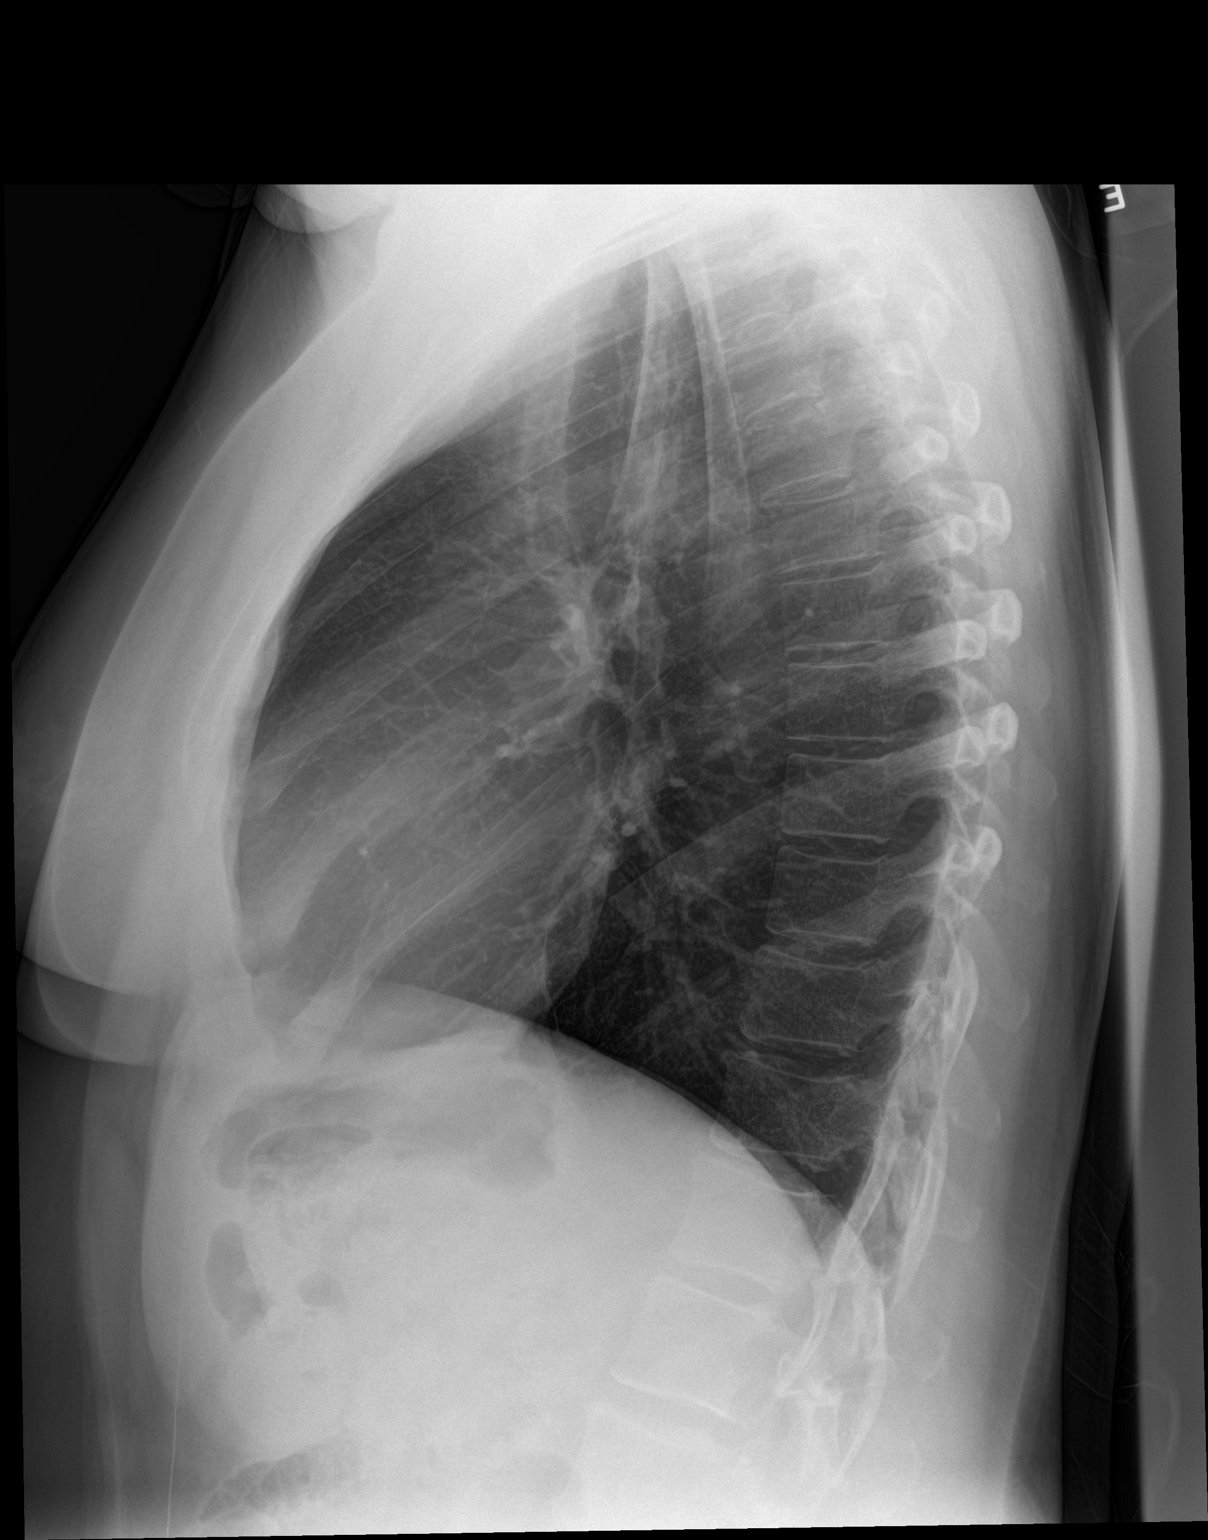

[2 of 2 positions shown; findings below may reference images not displayed]

FINDINGS: The heart size and mediastinal contours are within normal limits.
Both lungs are clear. The visualized skeletal structures are
unremarkable.
IMPRESSION: Normal chest x-ray.

## 2017-04-25 IMAGING — CR DG CHEST 2V
2 series · 2 of 2 positions shown · non-contrast
Comparison: 07/05/2015

CLINICAL DATA: Midchest pain and to the upper mid back for 1 week

EXAM:
CHEST  2 VIEW

[chest pa]
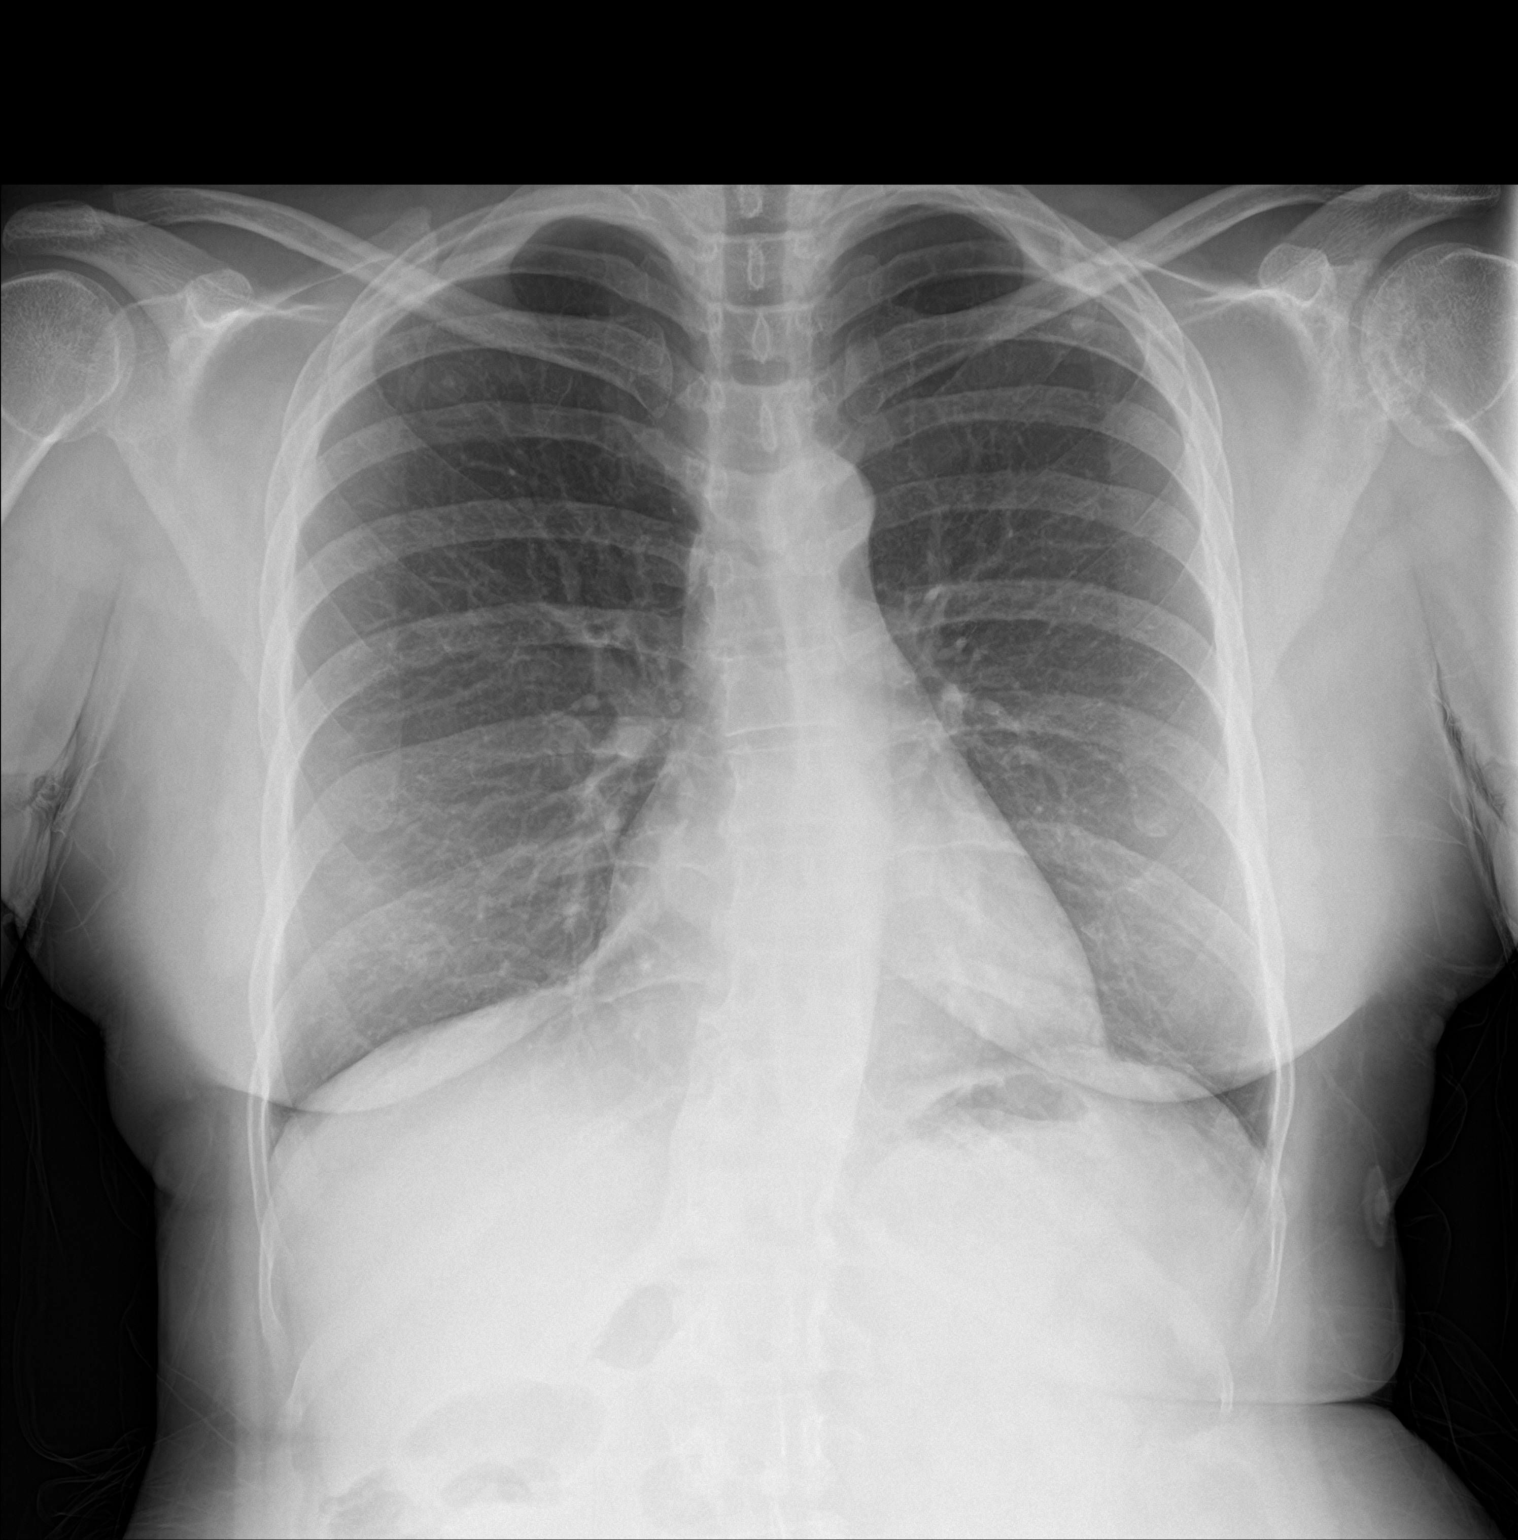

[chest lat]
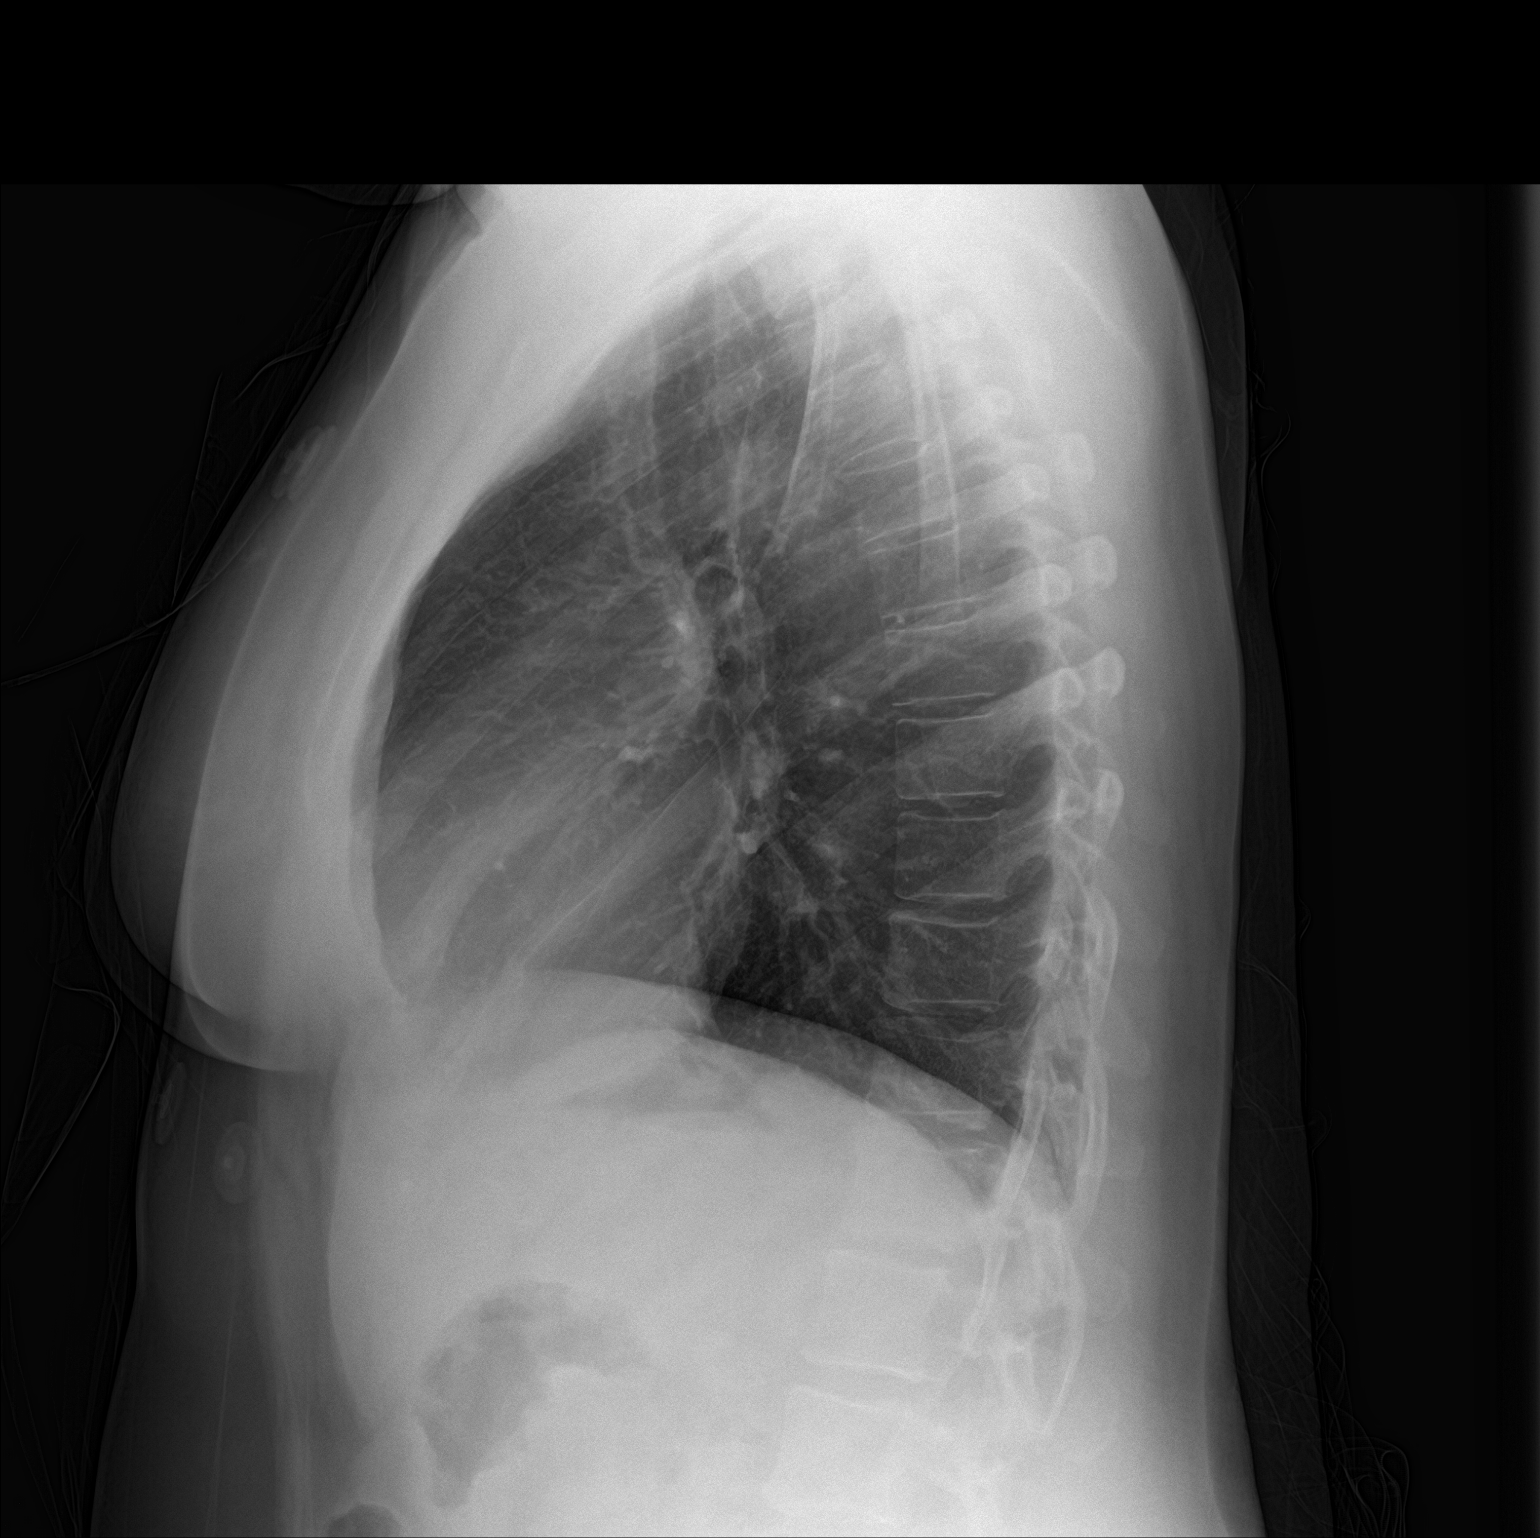

[2 of 2 positions shown; findings below may reference images not displayed]

FINDINGS: There is minimal linear atelectatic appearing opacity in the left
base. The lungs are otherwise clear. The pulmonary vasculature is
normal. Hilar, mediastinal and cardiac contours are unremarkable and
unchanged.

Severe chronic left shoulder arthropathy incidentally noted.
IMPRESSION: Mild atelectatic appearing left base opacities.

## 2017-07-12 DIAGNOSIS — R194 Change in bowel habit: Secondary | ICD-10-CM | POA: Diagnosis not present

## 2017-07-12 DIAGNOSIS — R110 Nausea: Secondary | ICD-10-CM | POA: Diagnosis not present

## 2017-07-12 DIAGNOSIS — Z8619 Personal history of other infectious and parasitic diseases: Secondary | ICD-10-CM | POA: Diagnosis not present

## 2017-10-28 DIAGNOSIS — R197 Diarrhea, unspecified: Secondary | ICD-10-CM | POA: Diagnosis not present

## 2017-10-28 DIAGNOSIS — Z8619 Personal history of other infectious and parasitic diseases: Secondary | ICD-10-CM | POA: Diagnosis not present

## 2017-11-30 DIAGNOSIS — L03119 Cellulitis of unspecified part of limb: Secondary | ICD-10-CM | POA: Diagnosis not present

## 2017-11-30 DIAGNOSIS — B353 Tinea pedis: Secondary | ICD-10-CM | POA: Diagnosis not present

## 2017-11-30 DIAGNOSIS — L02619 Cutaneous abscess of unspecified foot: Secondary | ICD-10-CM | POA: Diagnosis not present

## 2017-12-22 DIAGNOSIS — R1013 Epigastric pain: Secondary | ICD-10-CM | POA: Diagnosis not present

## 2017-12-22 DIAGNOSIS — R197 Diarrhea, unspecified: Secondary | ICD-10-CM | POA: Diagnosis not present

## 2018-02-02 DIAGNOSIS — I10 Essential (primary) hypertension: Secondary | ICD-10-CM | POA: Diagnosis not present

## 2018-02-02 DIAGNOSIS — Z8619 Personal history of other infectious and parasitic diseases: Secondary | ICD-10-CM | POA: Diagnosis not present

## 2018-02-02 DIAGNOSIS — Z23 Encounter for immunization: Secondary | ICD-10-CM | POA: Diagnosis not present

## 2018-02-02 DIAGNOSIS — Z0000 Encounter for general adult medical examination without abnormal findings: Secondary | ICD-10-CM | POA: Diagnosis not present

## 2018-02-02 DIAGNOSIS — E785 Hyperlipidemia, unspecified: Secondary | ICD-10-CM | POA: Diagnosis not present

## 2018-02-03 DIAGNOSIS — R197 Diarrhea, unspecified: Secondary | ICD-10-CM | POA: Diagnosis not present

## 2018-04-29 DIAGNOSIS — Z8711 Personal history of peptic ulcer disease: Secondary | ICD-10-CM | POA: Diagnosis not present

## 2018-04-29 DIAGNOSIS — K2970 Gastritis, unspecified, without bleeding: Secondary | ICD-10-CM | POA: Diagnosis not present

## 2018-04-29 DIAGNOSIS — R197 Diarrhea, unspecified: Secondary | ICD-10-CM | POA: Diagnosis not present

## 2018-04-29 DIAGNOSIS — K5730 Diverticulosis of large intestine without perforation or abscess without bleeding: Secondary | ICD-10-CM | POA: Diagnosis not present

## 2018-04-29 DIAGNOSIS — K259 Gastric ulcer, unspecified as acute or chronic, without hemorrhage or perforation: Secondary | ICD-10-CM | POA: Diagnosis not present

## 2018-04-29 DIAGNOSIS — Z7982 Long term (current) use of aspirin: Secondary | ICD-10-CM | POA: Diagnosis not present

## 2018-04-29 DIAGNOSIS — K2950 Unspecified chronic gastritis without bleeding: Secondary | ICD-10-CM | POA: Diagnosis not present

## 2018-04-29 DIAGNOSIS — Z8719 Personal history of other diseases of the digestive system: Secondary | ICD-10-CM | POA: Diagnosis not present

## 2018-04-29 DIAGNOSIS — I10 Essential (primary) hypertension: Secondary | ICD-10-CM | POA: Diagnosis not present

## 2018-04-29 DIAGNOSIS — Z79899 Other long term (current) drug therapy: Secondary | ICD-10-CM | POA: Diagnosis not present

## 2018-10-10 ENCOUNTER — Emergency Department
Admission: EM | Admit: 2018-10-10 | Discharge: 2018-10-11 | Disposition: A | Discharge status: Home / Self Care | Payer: 59 | Attending: Emergency Medicine | Admitting: Emergency Medicine

## 2018-10-10 ENCOUNTER — Other Ambulatory Visit: Payer: Self-pay

## 2018-10-10 ENCOUNTER — Encounter: Payer: Self-pay | Admitting: Emergency Medicine

## 2018-10-10 DIAGNOSIS — T783XXA Angioneurotic edema, initial encounter: Secondary | ICD-10-CM | POA: Diagnosis not present

## 2018-10-10 DIAGNOSIS — Z79899 Other long term (current) drug therapy: Secondary | ICD-10-CM | POA: Insufficient documentation

## 2018-10-10 DIAGNOSIS — I10 Essential (primary) hypertension: Secondary | ICD-10-CM | POA: Diagnosis not present

## 2018-10-10 DIAGNOSIS — Z7982 Long term (current) use of aspirin: Secondary | ICD-10-CM | POA: Diagnosis not present

## 2018-10-10 DIAGNOSIS — T7840XA Allergy, unspecified, initial encounter: Secondary | ICD-10-CM

## 2018-10-10 DIAGNOSIS — Z87891 Personal history of nicotine dependence: Secondary | ICD-10-CM | POA: Insufficient documentation

## 2018-10-10 DIAGNOSIS — L509 Urticaria, unspecified: Secondary | ICD-10-CM | POA: Diagnosis not present

## 2018-10-10 DIAGNOSIS — Z8673 Personal history of transient ischemic attack (TIA), and cerebral infarction without residual deficits: Secondary | ICD-10-CM | POA: Diagnosis not present

## 2018-10-10 DIAGNOSIS — K148 Other diseases of tongue: Secondary | ICD-10-CM | POA: Diagnosis present

## 2018-10-10 MED ORDER — SODIUM CHLORIDE 0.9 % IV BOLUS
1000.0000 mL | Freq: Once | INTRAVENOUS | Status: AC
  Administered 2018-10-11: 1000 mL via INTRAVENOUS

## 2018-10-10 MED ORDER — METHYLPREDNISOLONE SODIUM SUCC 125 MG IJ SOLR
125.0000 mg | Freq: Once | INTRAMUSCULAR | Status: AC
  Administered 2018-10-11: 125 mg via INTRAVENOUS
  Filled 2018-10-10: qty 2

## 2018-10-10 MED ORDER — FAMOTIDINE IN NACL 20-0.9 MG/50ML-% IV SOLN
20.0000 mg | Freq: Once | INTRAVENOUS | Status: AC
  Administered 2018-10-11: 20 mg via INTRAVENOUS
  Filled 2018-10-10: qty 50

## 2018-10-10 MED ORDER — EPINEPHRINE 0.3 MG/0.3ML IJ SOAJ
0.3000 mg | Freq: Once | INTRAMUSCULAR | Status: AC
  Administered 2018-10-11: 0.3 mg via INTRAMUSCULAR
  Filled 2018-10-10: qty 0.3

## 2018-10-10 MED ORDER — DIPHENHYDRAMINE HCL 50 MG/ML IJ SOLN
25.0000 mg | Freq: Once | INTRAMUSCULAR | Status: AC
  Administered 2018-10-11: 25 mg via INTRAVENOUS
  Filled 2018-10-10: qty 1

## 2018-10-10 NOTE — ED Provider Notes (Signed)
[facility] Emergency Department Provider Note   ____________________________________________   First MD Initiated Contact with Patient 10/10/18 2340     (approximate)  I have reviewed the triage vital signs and the nursing notes.   HISTORY  Chief Complaint Allergic Reaction    HPI Mariah Christian is a 58 y.o. 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  Patient had just eaten [employer] when she felt itchiness and hives to her hands and a strange sensation to her tongue.  She looked in the mirror and saw that the left side of her tongue was swollen.  Difficulty swallowing.  Took 1 Benadryl approximately 30 minutes prior to arrival.  Has not experienced similar symptoms previously.  Denies ACE inhibitor use, new medicines or food exposures.  Denies fever, cough, chest pain, shortness of breath, abdominal pain, nausea or vomiting.       Past Medical History:  Diagnosis Date  . Hypertension     Patient Active Problem List   Diagnosis Date Noted  . Fibroids 03/19/2016  . History of anemia 03/19/2016  . GERD (gastroesophageal reflux disease) 03/19/2016  . Hypertension 03/19/2016  . Ovarian cyst 03/19/2016  . Chronic hepatitis C without hepatic coma ([place]) 01/07/2016  . Migraine 01/17/2015  . Weakness of left side of body 01/17/2015  . Stroke ([place]) 12/04/2014  . Leg pain 10/07/2012    Past Surgical History:  Procedure Laterality Date  . APPENDECTOMY    . TONSILLECTOMY      Prior to Admission medications   Medication Sig Start Date End Date Taking? Authorizing Provider  aspirin 81 MG chewable tablet Chew 81 mg by mouth daily.    [provider]  atenolol (TENORMIN) 50 MG tablet Take by mouth. 12/30/15   [provider]  diazepam (VALIUM) 5 MG tablet Take 1 tablet by mouth every 6 (six) hours as needed. 12/30/15   [provider]  EPINEPHrine 0.3 mg/0.3 mL IJ  SOAJ injection Inject 0.3 mLs (0.3 mg total) into the muscle once for 1 dose. 10/11/18 10/11/18  Paulette Blanch, MD  famotidine (PEPCID) 20 MG tablet Take 1 tablet (20 mg total) by mouth 2 (two) times daily. 10/11/18   Paulette Blanch, MD  gabapentin (NEURONTIN) 300 MG capsule Take by mouth. 12/30/15   [provider]  Ledipasvir-Sofosbuvir (HARVONI) 90-400 MG TABS Take 1 tablet by mouth daily. 03/19/16   Thayer Headings, MD  Multiple Vitamins-Minerals (MULTIVITAMIN WITH MINERALS) tablet Take 1 tablet by mouth daily.    [provider]  naproxen (NAPROSYN) 500 MG tablet Take 1 tablet (500 mg total) by mouth 2 (two) times daily. Patient taking differently: Take 500 mg by mouth as needed.  07/10/15   Horton, Barbette Hair, MD  predniSONE (DELTASONE) 20 MG tablet 3 tablets PO qd x 4 days 10/11/18   Paulette Blanch, MD    Allergies Doxycycline  No family history on file.  Social History Social History   Tobacco Use  . Smoking status: Former Research scientist (life sciences)  . Smokeless tobacco: Never Used  Substance Use Topics  . Alcohol use: Yes    Comment: occassionally  . Drug use: No    Review of Systems  Constitutional: No fever/chills Eyes: No visual changes. ENT: Positive for left tongue swelling.  No sore throat. Cardiovascular: Denies chest pain. Respiratory: Denies shortness of breath. Gastrointestinal: No abdominal pain.  No nausea, no vomiting.  No  diarrhea.  No constipation. Genitourinary: Negative for dysuria. Musculoskeletal: Negative for back pain. Skin: Negative for rash. Neurological: Negative for headaches, focal weakness or numbness.   ____________________________________________   PHYSICAL EXAM:  VITAL SIGNS: ED Triage Vitals  Enc Vitals Group     BP 10/10/18 2337 (!) 164/91     Pulse Rate 10/10/18 2337 85     Resp 10/10/18 2337 18     Temp 10/10/18 2337 98 F (36.7 C)     Temp Source 10/10/18 2337 Oral     SpO2 10/10/18 2337 100 %     Weight 10/10/18 2332 190 lb (86.2  kg)     Height 10/10/18 2332 5\' 9"  (1.753 m)     Head Circumference --      Peak Flow --      Pain Score 10/10/18 2332 0     Pain Loc --      Pain Edu? --      Excl. in [place]? --     Constitutional: Alert and oriented. Well appearing and in mild acute distress. Eyes: Conjunctivae are normal. PERRL. EOMI. Head: Atraumatic. Nose: No congestion/rhinnorhea. Mouth/Throat: Mucous membranes are moist.  Left tongue mildly swollen.  No swelling in posterior oropharynx.  Mildly muffled voice.  No drooling.  Tolerating secretions well.. Neck: No stridor.  Soft submental space. Cardiovascular: Normal rate, regular rhythm. Grossly normal heart sounds.  Good peripheral circulation. Respiratory: Normal respiratory effort.  No retractions. Lungs CTAB. Gastrointestinal: Soft and nontender. No distention. No abdominal bruits. No CVA tenderness. Musculoskeletal: No lower extremity tenderness nor edema.  No joint effusions. Neurologic:  Normal speech and language. No gross focal neurologic deficits are appreciated. No gait instability. Skin:  Skin is warm, dry and intact.  Scattered hives on hands which are itchy. Psychiatric: Mood and affect are normal. Speech and behavior are normal.  ____________________________________________   LABS (all labs ordered are listed, but only abnormal results are displayed)  Labs Reviewed - No data to display ____________________________________________  EKG  None ____________________________________________  RADIOLOGY  ED MD interpretation:  None  Official radiology report(s): No results found.  ____________________________________________   PROCEDURES  Procedure(s) performed (including Critical Care):  Procedures   ____________________________________________   INITIAL IMPRESSION / ASSESSMENT AND PLAN / ED COURSE  As part of my medical decision making, I reviewed the following data within the [place] notes reviewed  and incorporated, Old chart reviewed and Notes from prior ED visits     Mariah Christian was evaluated in Emergency Department on 10/11/2018 for the symptoms described in the history of present illness. She was evaluated in the context of the global COVID-19 pandemic, which necessitated consideration that the patient might be at risk for infection with the SARS-CoV-2 virus that causes COVID-19. Institutional protocols and algorithms that pertain to the evaluation of patients at risk for COVID-19 are in a state of rapid change based on information released by regulatory bodies including the CDC and federal and state organizations. These policies and algorithms were followed during the patient's care in the ED.   58 year old female who presents with left-sided tongue swelling after eating [employer].  Will administer IV allergic reaction cocktail to include Solu-Medrol, Benadryl, Pepcid and fluids.  Administer EpiPen.  Will monitor in the ED for minimum of 3 hours.   Clinical Course as of Oct 10 412  Tue Oct 11, 2018  0151 Swelling improved.  Hives resolved.  Patient feeling better.  We will continue to monitor.   [  JS]  [phone] Reassessed patient who is feeling better.  Tongue swelling continues to improve.  Room air saturations 100%.  Phonation much more clear.  Will discharge home on EpiPen, prednisone and Pepcid.  Refer to ENT for outpatient allergy testing.  Strict return precautions given.  Patient verbalizes understanding and agrees with plan of care.   [JS]    Clinical Course User Index [JS] Paulette Blanch, MD     ____________________________________________   FINAL CLINICAL IMPRESSION(S) / ED DIAGNOSES  Final diagnoses:  Allergic reaction, initial encounter  Hives  Angioedema, initial encounter     ED Discharge Orders         Ordered    predniSONE (DELTASONE) 20 MG tablet     10/11/18 0342    EPINEPHrine 0.3 mg/0.3 mL IJ SOAJ injection   Once     10/11/18 0342    famotidine  (PEPCID) 20 MG tablet  2 times daily     10/11/18 0342           Note:  This document was prepared using Dragon voice recognition software and may include unintentional dictation errors.   Paulette Blanch, MD 10/11/18 [phone]

## 2018-10-10 NOTE — ED Triage Notes (Signed)
Patient ambulatory to triage with steady gait, without difficulty or distress noted, mask in place; pt reports onset swelling to tongue, difficulty swallowing, itching to arms/hands with no known cause; 1 benadryl taken 28min PTA; denies hx of same

## 2018-10-11 DIAGNOSIS — L509 Urticaria, unspecified: Secondary | ICD-10-CM | POA: Diagnosis not present

## 2018-10-11 DIAGNOSIS — I10 Essential (primary) hypertension: Secondary | ICD-10-CM | POA: Diagnosis not present

## 2018-10-11 DIAGNOSIS — T783XXA Angioneurotic edema, initial encounter: Secondary | ICD-10-CM | POA: Diagnosis not present

## 2018-10-11 DIAGNOSIS — Z8673 Personal history of transient ischemic attack (TIA), and cerebral infarction without residual deficits: Secondary | ICD-10-CM | POA: Diagnosis not present

## 2018-10-11 DIAGNOSIS — Z7982 Long term (current) use of aspirin: Secondary | ICD-10-CM | POA: Diagnosis not present

## 2018-10-11 DIAGNOSIS — Z79899 Other long term (current) drug therapy: Secondary | ICD-10-CM | POA: Diagnosis not present

## 2018-10-11 DIAGNOSIS — Z87891 Personal history of nicotine dependence: Secondary | ICD-10-CM | POA: Diagnosis not present

## 2018-10-11 MED ORDER — FAMOTIDINE 20 MG PO TABS: 20.0000 mg | ORAL_TABLET | Freq: Two times a day (BID) | ORAL | 0 refills | Status: AC

## 2018-10-11 MED ORDER — EPINEPHRINE 0.3 MG/0.3ML IJ SOAJ: 0.3000 mg | Freq: Once | INTRAMUSCULAR | 2 refills | Status: AC

## 2018-10-11 MED ORDER — PREDNISONE 20 MG PO TABS: ORAL_TABLET | ORAL | 0 refills | Status: AC

## 2018-10-11 NOTE — Discharge Instructions (Addendum)
1. Take the following medicines for the next 4 days: Prednisone 60mg daily Pepcid 20mg twice daily 2. Take Benadryl as needed for itching. 3. Use Epi-Pen in case of acute, life-threatening allergic reaction. 4. Return to the ER for worsening symptoms, persistent vomiting, difficulty breathing or other concerns.  

## 2019-01-10 ENCOUNTER — Other Ambulatory Visit: Payer: Self-pay

## 2019-01-10 ENCOUNTER — Emergency Department: Payer: 59

## 2019-01-10 ENCOUNTER — Emergency Department
Admission: EM | Admit: 2019-01-10 | Discharge: 2019-01-10 | Disposition: A | Discharge status: Home / Self Care | Payer: 59 | Attending: Student in an Organized Health Care Education/Training Program | Admitting: Student in an Organized Health Care Education/Training Program

## 2019-01-10 DIAGNOSIS — R197 Diarrhea, unspecified: Secondary | ICD-10-CM | POA: Diagnosis not present

## 2019-01-10 DIAGNOSIS — Z20828 Contact with and (suspected) exposure to other viral communicable diseases: Secondary | ICD-10-CM | POA: Insufficient documentation

## 2019-01-10 DIAGNOSIS — R0602 Shortness of breath: Secondary | ICD-10-CM | POA: Diagnosis not present

## 2019-01-10 DIAGNOSIS — Z79899 Other long term (current) drug therapy: Secondary | ICD-10-CM | POA: Insufficient documentation

## 2019-01-10 DIAGNOSIS — I10 Essential (primary) hypertension: Secondary | ICD-10-CM | POA: Diagnosis not present

## 2019-01-10 DIAGNOSIS — Z87891 Personal history of nicotine dependence: Secondary | ICD-10-CM | POA: Insufficient documentation

## 2019-01-10 DIAGNOSIS — R531 Weakness: Secondary | ICD-10-CM | POA: Diagnosis present

## 2019-01-10 DIAGNOSIS — J111 Influenza due to unidentified influenza virus with other respiratory manifestations: Secondary | ICD-10-CM | POA: Insufficient documentation

## 2019-01-10 DIAGNOSIS — Z7982 Long term (current) use of aspirin: Secondary | ICD-10-CM | POA: Diagnosis not present

## 2019-01-10 LAB — HEPATIC FUNCTION PANEL
ALT: 15 U/L (ref 0–44)
AST: 19 U/L (ref 15–41)
Albumin: 4.6 g/dL (ref 3.5–5.0)
Alkaline Phosphatase: 58 U/L (ref 38–126)
Bilirubin, Direct: 0.1 mg/dL (ref 0.0–0.2)
Indirect Bilirubin: 1.4 mg/dL — ABNORMAL HIGH (ref 0.3–0.9)
Total Bilirubin: 1.5 mg/dL — ABNORMAL HIGH (ref 0.3–1.2)
Total Protein: 7.9 g/dL (ref 6.5–8.1)

## 2019-01-10 LAB — BASIC METABOLIC PANEL
Anion gap: 11 (ref 5–15)
BUN: 18 mg/dL (ref 6–20)
CO2: 25 mmol/L (ref 22–32)
Calcium: 9.7 mg/dL (ref 8.9–10.3)
Chloride: 104 mmol/L (ref 98–111)
Creatinine, Ser: 1.13 mg/dL — ABNORMAL HIGH (ref 0.44–1.00)
GFR calc Af Amer: >60 mL/min (ref >60)
GFR calc non Af Amer: 54 mL/min — ABNORMAL LOW (ref >60)
Glucose, Bld: 113 mg/dL — ABNORMAL HIGH (ref 70–99)
Potassium: 3.7 mmol/L (ref 3.5–5.1)
Sodium: 140 mmol/L (ref 135–145)

## 2019-01-10 LAB — URINALYSIS, COMPLETE (UACMP) WITH MICROSCOPIC
Bacteria, UA: NONE SEEN
Bilirubin Urine: NEGATIVE
Glucose, UA: NEGATIVE mg/dL
Hgb urine dipstick: NEGATIVE
Ketones, ur: NEGATIVE mg/dL
Nitrite: NEGATIVE
Protein, ur: NEGATIVE mg/dL
Specific Gravity, Urine: 1.015 (ref 1.005–1.030)
pH: 5 (ref 5.0–8.0)

## 2019-01-10 LAB — CBC
HCT: 40 % (ref 36.0–46.0)
Hemoglobin: 13.3 g/dL (ref 12.0–15.0)
MCH: 29.5 pg (ref 26.0–34.0)
MCHC: 33.3 g/dL (ref 30.0–36.0)
MCV: 88.7 fL (ref 80.0–100.0)
Platelets: 242 10*3/uL (ref 150–400)
RBC: 4.51 MIL/uL (ref 3.87–5.11)
RDW: 12.5 % (ref 11.5–15.5)
WBC: 7.9 10*3/uL (ref 4.0–10.5)
nRBC: 0 % (ref 0.0–0.2)

## 2019-01-10 LAB — SARS CORONAVIRUS 2 BY RT PCR (HOSPITAL ORDER, PERFORMED IN ~~LOC~~ HOSPITAL LAB): SARS Coronavirus 2: NEGATIVE

## 2019-01-10 MED ORDER — SODIUM CHLORIDE 0.9% FLUSH: 3.0000 mL | Freq: Once | INTRAVENOUS | Status: DC

## 2019-01-10 MED ORDER — SODIUM CHLORIDE 0.9 % IV BOLUS
500.0000 mL | Freq: Once | INTRAVENOUS | Status: AC
  Administered 2019-01-10: 500 mL via INTRAVENOUS

## 2019-01-10 MED ORDER — ONDANSETRON HCL 4 MG/2ML IJ SOLN
4.0000 mg | Freq: Once | INTRAMUSCULAR | Status: AC
  Administered 2019-01-10: 4 mg via INTRAVENOUS
  Filled 2019-01-10: qty 2

## 2019-01-10 MED ORDER — ONDANSETRON HCL 4 MG PO TABS: 4.0000 mg | ORAL_TABLET | Freq: Every day | ORAL | 0 refills | Status: AC | PRN

## 2019-01-10 NOTE — ED Provider Notes (Signed)
[facility] Emergency Department Provider Note    First MD Initiated Contact with Patient 01/10/19 1755     (approximate)  I have reviewed the triage vital signs and the nursing notes.   HISTORY  Chief Complaint Weakness and Shortness of Breath    HPI Mariah Christian is a 58 y.o. female presents the ER for evaluation of generalized malaise sore throat nausea and 3 episodes of watery diarrhea associated with shortness of breath. No ear pain. Does work in a nursing facility and though she is being monitored for covid, is concerned she may have contracted it.  Denies any abdominal pain.  No chest pain or pressure.  Feels generalized malaise.    Has had some chills.  Had screening COVID test beginning the week but started feeling sick over the past 24 hours. no recent abx use.   Past Medical History:  Diagnosis Date  . Hypertension    No family history on file. Past Surgical History:  Procedure Laterality Date  . APPENDECTOMY    . TONSILLECTOMY     Patient Active Problem List   Diagnosis Date Noted  . Fibroids 03/19/2016  . History of anemia 03/19/2016  . GERD (gastroesophageal reflux disease) 03/19/2016  . Hypertension 03/19/2016  . Ovarian cyst 03/19/2016  . Chronic hepatitis C without hepatic coma ([place]) 01/07/2016  . Migraine 01/17/2015  . Weakness of left side of body 01/17/2015  . Stroke ([place]) 12/04/2014  . Leg pain 10/07/2012      Prior to Admission medications   Medication Sig Start Date End Date Taking? Authorizing Provider  aspirin 81 MG chewable tablet Chew 81 mg by mouth daily.    [provider]  atenolol (TENORMIN) 50 MG tablet Take by mouth. 12/30/15   [provider]  diazepam (VALIUM) 5 MG tablet Take 1 tablet by mouth every 6 (six) hours as needed. 12/30/15   [provider]  famotidine (PEPCID) 20 MG tablet Take 1 tablet (20 mg total) by mouth 2 (two) times daily. 10/11/18   Paulette Blanch, MD   gabapentin (NEURONTIN) 300 MG capsule Take by mouth. 12/30/15   [provider]  Ledipasvir-Sofosbuvir (HARVONI) 90-400 MG TABS Take 1 tablet by mouth daily. 03/19/16   Thayer Headings, MD  Multiple Vitamins-Minerals (MULTIVITAMIN WITH MINERALS) tablet Take 1 tablet by mouth daily.    [provider]  naproxen (NAPROSYN) 500 MG tablet Take 1 tablet (500 mg total) by mouth 2 (two) times daily. Patient taking differently: Take 500 mg by mouth as needed.  07/10/15   Horton, Barbette Hair, MD  ondansetron (ZOFRAN) 4 MG tablet Take 1 tablet (4 mg total) by mouth daily as needed. 01/10/19 01/10/20  Merlyn Lot, MD  predniSONE (DELTASONE) 20 MG tablet 3 tablets PO qd x 4 days 10/11/18   Paulette Blanch, MD    Allergies Doxycycline    Social History Social History   Tobacco Use  . Smoking status: Former Research scientist (life sciences)  . Smokeless tobacco: Never Used  Substance Use Topics  . Alcohol use: Yes    Comment: occassionally  . Drug use: No    Review of Systems Patient denies headaches, rhinorrhea, blurry vision, numbness, shortness of breath, chest pain, edema, cough, abdominal pain, nausea, vomiting, diarrhea, dysuria, fevers, rashes or hallucinations unless otherwise stated above in HPI. ____________________________________________   PHYSICAL EXAM:  VITAL SIGNS: Vitals:   01/10/19 2034 01/10/19 2035  BP:    Pulse: 77 91  Resp:  Temp:    SpO2: 99% 100%    Constitutional: Alert and oriented.  Eyes: Conjunctivae are normal.  Head: Atraumatic. Nose: No congestion/rhinnorhea. Mouth/Throat: Mucous membranes are moist.   Neck: No stridor. Painless ROM.  Cardiovascular: Normal rate, regular rhythm. Grossly normal heart sounds.  Good peripheral circulation. Respiratory: Normal respiratory effort.  No retractions. Lungs CTAB. Gastrointestinal: Soft and nontender. No distention. No abdominal bruits. No CVA tenderness. Genitourinary:  Musculoskeletal: No lower extremity  tenderness nor edema.  No joint effusions. Neurologic:  Normal speech and language. No gross focal neurologic deficits are appreciated. No facial droop Skin:  Skin is warm, dry and intact. No rash noted. Psychiatric: Mood and affect are normal. Speech and behavior are normal.  ____________________________________________   LABS (all labs ordered are listed, but only abnormal results are displayed)  Results for orders placed or performed during the hospital encounter of 01/10/19 (from the past 24 hour(s))  Basic metabolic panel     Status: Abnormal   Collection Time: 01/10/19  4:27 PM  Result Value Ref Range   Sodium 140 135 - 145 mmol/L   Potassium 3.7 3.5 - 5.1 mmol/L   Chloride 104 98 - 111 mmol/L   CO2 25 22 - 32 mmol/L   Glucose, Bld 113 (H) 70 - 99 mg/dL   BUN 18 6 - 20 mg/dL   Creatinine, Ser 1.13 (H) 0.44 - 1.00 mg/dL   Calcium 9.7 8.9 - 10.3 mg/dL   GFR calc non Af Amer 54 (L) >60 mL/min   GFR calc Af Amer >60 >60 mL/min   Anion gap 11 5 - 15  CBC     Status: None   Collection Time: 01/10/19  4:27 PM  Result Value Ref Range   WBC 7.9 4.0 - 10.5 K/uL   RBC 4.51 3.87 - 5.11 MIL/uL   Hemoglobin 13.3 12.0 - 15.0 g/dL   HCT 40.0 36.0 - 46.0 %   MCV 88.7 80.0 - 100.0 fL   MCH 29.5 26.0 - 34.0 pg   MCHC 33.3 30.0 - 36.0 g/dL   RDW 12.5 11.5 - 15.5 %   Platelets 242 150 - 400 K/uL   nRBC 0.0 0.0 - 0.2 %  Hepatic function panel     Status: Abnormal   Collection Time: 01/10/19  4:27 PM  Result Value Ref Range   Total Protein 7.9 6.5 - 8.1 g/dL   Albumin 4.6 3.5 - 5.0 g/dL   AST 19 15 - 41 U/L   ALT 15 0 - 44 U/L   Alkaline Phosphatase 58 38 - 126 U/L   Total Bilirubin 1.5 (H) 0.3 - 1.2 mg/dL   Bilirubin, Direct 0.1 0.0 - 0.2 mg/dL   Indirect Bilirubin 1.4 (H) 0.3 - 0.9 mg/dL  Urinalysis, Complete w Microscopic     Status: Abnormal   Collection Time: 01/10/19  4:28 PM  Result Value Ref Range   Color, Urine YELLOW (A) YELLOW   APPearance CLEAR (A) CLEAR   Specific  Gravity, Urine 1.015 1.005 - 1.030   pH 5.0 5.0 - 8.0   Glucose, UA NEGATIVE NEGATIVE mg/dL   Hgb urine dipstick NEGATIVE NEGATIVE   Bilirubin Urine NEGATIVE NEGATIVE   Ketones, ur NEGATIVE NEGATIVE mg/dL   Protein, ur NEGATIVE NEGATIVE mg/dL   Nitrite NEGATIVE NEGATIVE   Leukocytes,Ua TRACE (A) NEGATIVE   RBC / HPF 0-5 0 - 5 RBC/hpf   WBC, UA 0-5 0 - 5 WBC/hpf   Bacteria, UA NONE SEEN NONE SEEN  Squamous Epithelial / LPF 0-5 0 - 5   Mucus PRESENT   SARS Coronavirus 2 by RT PCR (hospital order, performed in [facility] hospital lab) Nasopharyngeal Nasopharyngeal Swab     Status: None   Collection Time: 01/10/19  6:31 PM   Specimen: Nasopharyngeal Swab  Result Value Ref Range   SARS Coronavirus 2 NEGATIVE NEGATIVE   ____________________________________________  EKG My review and personal interpretation at Time: 16:14   Indication: sob  Rate: 70  Rhythm: sinus Axis: normal Other: normal intervals, nonspecific st  And t wave abn consistent with previous tracings ____________________________________________  RADIOLOGY  I personally reviewed all radiographic images ordered to evaluate for the above acute complaints and reviewed radiology reports and findings.  These findings were personally discussed with the patient.  Please see medical record for radiology report.  ____________________________________________   PROCEDURES  Procedure(s) performed:  Procedures    Critical Care performed: no ____________________________________________   INITIAL IMPRESSION / ASSESSMENT AND PLAN / ED COURSE  Pertinent labs & imaging results that were available during my care of the patient were reviewed by me and considered in my medical decision making (see chart for details).   DDX: covid 19, ili, pna, dehydration, colitis  SUZEN HETZER is a 58 y.o. who presents to the ED with symptoms as described above.  Patient nontoxic-appearing afebrile and hemodynamically stable.  Abdominal  exam is soft and benign.  Will test for COVID.  Will give IV fluids as well as antiemetic and reassess.  Clinical Course as of Jan 09 2042  Tue Jan 10, 2019  2029 Patient reassedded.  States that she feels better after IV fluids and antiemetic.  Repeat abdominal exam soft and benign.  Blood work so far is reassuring.  I have some mild dehydration.  Borderline elevation of bilirubin but no right upper quadrant tenderness.   [PR]  2038 Patient able to ambulate without any hypoxia.  Tolerating oral hydration.  No additional diarrheal episodes.  Does not seem consistent with C. difficile.  Do believe patient is appropriate for outpatient follow-up.   [PR]    Clinical Course User Index [PR] Merlyn Lot, MD    The patient was evaluated in Emergency Department today for the symptoms described in the history of present illness. He/she was evaluated in the context of the global COVID-19 pandemic, which necessitated consideration that the patient might be at risk for infection with the SARS-CoV-2 virus that causes COVID-19. Institutional protocols and algorithms that pertain to the evaluation of patients at risk for COVID-19 are in a state of rapid change based on information released by regulatory bodies including the CDC and federal and state organizations. These policies and algorithms were followed during the patient's care in the ED.  As part of my medical decision making, I reviewed the following data within the [place] notes reviewed and incorporated, Labs reviewed, notes from prior ED visits and [place] Controlled Substance Database   ____________________________________________   FINAL CLINICAL IMPRESSION(S) / ED DIAGNOSES  Final diagnoses:  Influenza-like illness  Diarrhea, unspecified type      NEW MEDICATIONS STARTED DURING THIS VISIT:  New Prescriptions   ONDANSETRON (ZOFRAN) 4 MG TABLET    Take 1 tablet (4 mg total) by mouth daily as needed.      Note:  This document was prepared using Dragon voice recognition software and may include unintentional dictation errors.    Merlyn Lot, MD 01/10/19 2044

## 2019-01-10 NOTE — ED Notes (Signed)
Ambulated patient in room. O2 stats 97-100 %.

## 2019-01-10 NOTE — ED Notes (Signed)
Patient states she was able to drink some ginger ale and eat a few crackers with no vomiting. Patient states she did feel nauseated.

## 2019-01-10 NOTE — ED Triage Notes (Signed)
Pt comes via POV from home with c/o nausea, SOB and weakness. Pt states this all started yesterday evening.  Pt states diarrhea. Pt states it comes in waves. Pt states it is hard to speak and catch her breath. Pt states sweating and clammy when she is trying to do activities.  Pt also states she felt like she was going to pass out. Pt states she called PCP and was informed to come to ED.  Pt states recent COVID test on Monday but has not gotten results yet.

## 2019-02-07 DIAGNOSIS — E663 Overweight: Secondary | ICD-10-CM | POA: Diagnosis not present

## 2019-02-07 DIAGNOSIS — Z131 Encounter for screening for diabetes mellitus: Secondary | ICD-10-CM | POA: Diagnosis not present

## 2019-02-07 DIAGNOSIS — Z1231 Encounter for screening mammogram for malignant neoplasm of breast: Secondary | ICD-10-CM | POA: Diagnosis not present

## 2019-02-07 DIAGNOSIS — Z0000 Encounter for general adult medical examination without abnormal findings: Secondary | ICD-10-CM | POA: Diagnosis not present

## 2019-02-07 DIAGNOSIS — J309 Allergic rhinitis, unspecified: Secondary | ICD-10-CM | POA: Diagnosis not present

## 2019-02-07 DIAGNOSIS — Z124 Encounter for screening for malignant neoplasm of cervix: Secondary | ICD-10-CM | POA: Diagnosis not present

## 2019-02-07 DIAGNOSIS — M5431 Sciatica, right side: Secondary | ICD-10-CM | POA: Diagnosis not present

## 2019-02-07 DIAGNOSIS — I10 Essential (primary) hypertension: Secondary | ICD-10-CM | POA: Diagnosis not present

## 2019-03-08 DIAGNOSIS — H40003 Preglaucoma, unspecified, bilateral: Secondary | ICD-10-CM | POA: Diagnosis not present

## 2019-04-07 DIAGNOSIS — Z23 Encounter for immunization: Secondary | ICD-10-CM | POA: Diagnosis not present

## 2019-05-05 DIAGNOSIS — Z23 Encounter for immunization: Secondary | ICD-10-CM | POA: Diagnosis not present

## 2019-05-12 DIAGNOSIS — B9689 Other specified bacterial agents as the cause of diseases classified elsewhere: Secondary | ICD-10-CM | POA: Diagnosis not present

## 2019-05-12 DIAGNOSIS — J019 Acute sinusitis, unspecified: Secondary | ICD-10-CM | POA: Diagnosis not present

## 2019-05-12 DIAGNOSIS — J40 Bronchitis, not specified as acute or chronic: Secondary | ICD-10-CM | POA: Diagnosis not present

## 2019-05-12 DIAGNOSIS — R05 Cough: Secondary | ICD-10-CM | POA: Diagnosis not present

## 2019-08-10 DIAGNOSIS — Z20822 Contact with and (suspected) exposure to covid-19: Secondary | ICD-10-CM | POA: Diagnosis not present

## 2019-08-10 DIAGNOSIS — D259 Leiomyoma of uterus, unspecified: Secondary | ICD-10-CM | POA: Diagnosis not present

## 2019-08-10 DIAGNOSIS — N200 Calculus of kidney: Secondary | ICD-10-CM | POA: Diagnosis not present

## 2019-08-10 DIAGNOSIS — R197 Diarrhea, unspecified: Secondary | ICD-10-CM | POA: Diagnosis not present

## 2019-08-10 DIAGNOSIS — R519 Headache, unspecified: Secondary | ICD-10-CM | POA: Diagnosis not present

## 2019-08-10 DIAGNOSIS — R1032 Left lower quadrant pain: Secondary | ICD-10-CM | POA: Diagnosis not present

## 2019-08-10 DIAGNOSIS — B182 Chronic viral hepatitis C: Secondary | ICD-10-CM | POA: Diagnosis not present

## 2019-08-10 DIAGNOSIS — E109 Type 1 diabetes mellitus without complications: Secondary | ICD-10-CM | POA: Diagnosis not present

## 2019-08-10 DIAGNOSIS — E279 Disorder of adrenal gland, unspecified: Secondary | ICD-10-CM | POA: Diagnosis not present

## 2019-08-10 DIAGNOSIS — D252 Subserosal leiomyoma of uterus: Secondary | ICD-10-CM | POA: Diagnosis not present

## 2019-08-10 DIAGNOSIS — I10 Essential (primary) hypertension: Secondary | ICD-10-CM | POA: Diagnosis not present

## 2019-08-30 DIAGNOSIS — E278 Other specified disorders of adrenal gland: Secondary | ICD-10-CM | POA: Diagnosis not present

## 2019-08-30 DIAGNOSIS — J309 Allergic rhinitis, unspecified: Secondary | ICD-10-CM | POA: Diagnosis not present

## 2019-08-30 DIAGNOSIS — K8689 Other specified diseases of pancreas: Secondary | ICD-10-CM | POA: Diagnosis not present

## 2019-08-30 DIAGNOSIS — N200 Calculus of kidney: Secondary | ICD-10-CM | POA: Diagnosis not present

## 2019-09-25 DIAGNOSIS — E278 Other specified disorders of adrenal gland: Secondary | ICD-10-CM | POA: Diagnosis not present

## 2019-10-12 DIAGNOSIS — E278 Other specified disorders of adrenal gland: Secondary | ICD-10-CM | POA: Diagnosis not present

## 2019-10-23 DIAGNOSIS — Z800 Family history of malignant neoplasm of digestive organs: Secondary | ICD-10-CM | POA: Diagnosis not present

## 2019-10-23 DIAGNOSIS — K8681 Exocrine pancreatic insufficiency: Secondary | ICD-10-CM | POA: Diagnosis not present

## 2019-10-23 DIAGNOSIS — R197 Diarrhea, unspecified: Secondary | ICD-10-CM | POA: Diagnosis not present

## 2019-11-08 DIAGNOSIS — K8681 Exocrine pancreatic insufficiency: Secondary | ICD-10-CM | POA: Diagnosis not present

## 2019-11-08 DIAGNOSIS — Z800 Family history of malignant neoplasm of digestive organs: Secondary | ICD-10-CM | POA: Diagnosis not present

## 2019-11-21 DIAGNOSIS — Z800 Family history of malignant neoplasm of digestive organs: Secondary | ICD-10-CM | POA: Diagnosis not present

## 2019-11-21 DIAGNOSIS — R1013 Epigastric pain: Secondary | ICD-10-CM | POA: Diagnosis not present

## 2019-11-21 DIAGNOSIS — K8681 Exocrine pancreatic insufficiency: Secondary | ICD-10-CM | POA: Diagnosis not present

## 2019-12-06 ENCOUNTER — Emergency Department
Admission: EM | Admit: 2019-12-06 | Discharge: 2019-12-06 | Disposition: A | Discharge status: Home / Self Care | Payer: 59 | Attending: Emergency Medicine | Admitting: Emergency Medicine

## 2019-12-06 ENCOUNTER — Other Ambulatory Visit: Payer: Self-pay

## 2019-12-06 DIAGNOSIS — T783XXA Angioneurotic edema, initial encounter: Secondary | ICD-10-CM | POA: Insufficient documentation

## 2019-12-06 DIAGNOSIS — T7840XA Allergy, unspecified, initial encounter: Secondary | ICD-10-CM | POA: Insufficient documentation

## 2019-12-06 DIAGNOSIS — Z87891 Personal history of nicotine dependence: Secondary | ICD-10-CM | POA: Diagnosis not present

## 2019-12-06 DIAGNOSIS — Z7982 Long term (current) use of aspirin: Secondary | ICD-10-CM | POA: Diagnosis not present

## 2019-12-06 DIAGNOSIS — I10 Essential (primary) hypertension: Secondary | ICD-10-CM | POA: Diagnosis not present

## 2019-12-06 MED ORDER — METHYLPREDNISOLONE SODIUM SUCC 125 MG IJ SOLR: 125.0000 mg | Freq: Once | INTRAMUSCULAR | Status: AC

## 2019-12-06 MED ORDER — METHYLPREDNISOLONE SODIUM SUCC 125 MG IJ SOLR
INTRAMUSCULAR | Status: AC
  Administered 2019-12-06: 125 mg via INTRAVENOUS
  Filled 2019-12-06: qty 2

## 2019-12-06 MED ORDER — FAMOTIDINE IN NACL 20-0.9 MG/50ML-% IV SOLN
20.0000 mg | Freq: Once | INTRAVENOUS | Status: AC
  Administered 2019-12-06: 20 mg via INTRAVENOUS

## 2019-12-06 MED ORDER — EPINEPHRINE 0.3 MG/0.3ML IJ SOAJ
INTRAMUSCULAR | Status: AC
  Filled 2019-12-06: qty 0.3

## 2019-12-06 MED ORDER — DIPHENHYDRAMINE HCL 50 MG/ML IJ SOLN
INTRAMUSCULAR | Status: AC
  Administered 2019-12-06: 50 mg via INTRAVENOUS
  Filled 2019-12-06: qty 1

## 2019-12-06 MED ORDER — DIPHENHYDRAMINE HCL 50 MG/ML IJ SOLN: 50.0000 mg | Freq: Once | INTRAMUSCULAR | Status: AC

## 2019-12-06 MED ORDER — EPINEPHRINE 0.3 MG/0.3ML IJ SOAJ: 0.3000 mg | INTRAMUSCULAR | 1 refills | Status: AC | PRN

## 2019-12-06 NOTE — ED Notes (Signed)
Pt states tongue feels not as tingly, mild swelling continues to be noted to L side of tongue at this time. Pt denies further needs, sister remains at bedside, call bell within reach of patient at this time.

## 2019-12-06 NOTE — ED Notes (Signed)
E-signature pad is not working.  Patient verbalizes understanding of instructions.

## 2019-12-06 NOTE — ED Provider Notes (Signed)
[facility] Emergency Department Provider Note   ____________________________________________   First MD Initiated Contact with Patient 12/06/19 1245     (approximate)  I have reviewed the triage vital signs and the nursing notes.   HISTORY  Chief Complaint Allergic Reaction    HPI Mariah Christian is a 59 y.o. female with past medical history of hypertension presents for left-sided tongue swelling after eating cereal with almonds this morning. Patient states that this is happened 1 time previously when she ate almonds and another meal but states that she did not know she was allergic to almonds. Patient endorses slight shortness of breath with difficulty swallowing that has worsened since onset approximately 2 hours prior to arrival. Patient denies any medications or epinephrine use prior to arrival.         Past Medical History:  Diagnosis Date  . Hypertension     Patient Active Problem List   Diagnosis Date Noted  . Fibroids 03/19/2016  . History of anemia 03/19/2016  . GERD (gastroesophageal reflux disease) 03/19/2016  . Hypertension 03/19/2016  . Ovarian cyst 03/19/2016  . Chronic hepatitis C without hepatic coma ([place]) 01/07/2016  . Migraine 01/17/2015  . Weakness of left side of body 01/17/2015  . Stroke ([place]) 12/04/2014  . Leg pain 10/07/2012    Past Surgical History:  Procedure Laterality Date  . APPENDECTOMY    . TONSILLECTOMY      Prior to Admission medications   Medication Sig Start Date End Date Taking? Authorizing Provider  aspirin 81 MG chewable tablet Chew 81 mg by mouth daily.    [provider]  atenolol (TENORMIN) 50 MG tablet Take by mouth. 12/30/15   [provider]  diazepam (VALIUM) 5 MG tablet Take 1 tablet by mouth every 6 (six) hours as needed. 12/30/15   [provider]  famotidine (PEPCID) 20 MG tablet Take 1 tablet (20 mg total) by mouth 2 (two) times daily. 10/11/18   Paulette Blanch,  MD  gabapentin (NEURONTIN) 300 MG capsule Take by mouth. 12/30/15   [provider]  Ledipasvir-Sofosbuvir (HARVONI) 90-400 MG TABS Take 1 tablet by mouth daily. 03/19/16   Thayer Headings, MD  Multiple Vitamins-Minerals (MULTIVITAMIN WITH MINERALS) tablet Take 1 tablet by mouth daily.    [provider]  naproxen (NAPROSYN) 500 MG tablet Take 1 tablet (500 mg total) by mouth 2 (two) times daily. Patient taking differently: Take 500 mg by mouth as needed.  07/10/15   Horton, Barbette Hair, MD  ondansetron (ZOFRAN) 4 MG tablet Take 1 tablet (4 mg total) by mouth daily as needed. 01/10/19 01/10/20  Merlyn Lot, MD  predniSONE (DELTASONE) 20 MG tablet 3 tablets PO qd x 4 days 10/11/18   Paulette Blanch, MD    Allergies Doxycycline  No family history on file.  Social History Social History   Tobacco Use  . Smoking status: Former Research scientist (life sciences)  . Smokeless tobacco: Never Used  Substance Use Topics  . Alcohol use: Yes    Comment: occassionally  . Drug use: No    Review of Systems Constitutional: No fever/chills Eyes: No visual changes. ENT: Positive sore throat. Positive tongue swelling Cardiovascular: Denies chest pain. Respiratory: Positive shortness of breath. Gastrointestinal: No abdominal pain.  No nausea, no vomiting.  No diarrhea. Genitourinary: Negative for dysuria. Musculoskeletal: Negative for acute arthralgias Skin: Negative for rash. Neurological: Negative for headaches, weakness/numbness/paresthesias in any extremity Psychiatric: Negative for suicidal ideation/homicidal ideation   ____________________________________________  PHYSICAL EXAM:  VITAL SIGNS: ED Triage Vitals [12/06/19 1251]  Enc Vitals Group     BP (!) 166/90     Pulse Rate 80     Resp 18     Temp 98 F (36.7 C)     Temp Source Oral     SpO2 99 %     Weight 190 lb (86.2 kg)     Height 5\' 8"  (1.727 m)     Head Circumference      Peak Flow      Pain Score 0     Pain Loc       Pain Edu?      Excl. in [place]?     Constitutional: Alert and oriented. Well appearing and in no acute distress. Eyes: Conjunctivae are normal. PERRL. EOMI. Head: Atraumatic. Nose: No congestion/rhinnorhea. Mouth/Throat: Mucous membranes are moist.  Oropharynx non-erythematous. Swelling to the left aspect of the tongue and handling secretions well Neck: No stridor.   Cardiovascular: Normal rate, regular rhythm. Grossly normal heart sounds.  Good peripheral circulation. Respiratory: Normal respiratory effort.  No retractions. Lungs CTAB. Gastrointestinal: Soft and nontender. No distention. No abdominal bruits. No CVA tenderness. Musculoskeletal: No lower extremity tenderness nor edema.  No joint effusions. Neurologic:  Normal speech and language. No gross focal neurologic deficits are appreciated. No gait instability. Skin:  Skin is warm, dry and intact. No rash noted. Psychiatric: Mood and affect are normal. Speech and behavior are normal.  ____________________________________________   LABS (all labs ordered are listed, but only abnormal results are displayed)  Labs Reviewed - No data to display ____________________________________________  EKG   ____________________________________________  RADIOLOGY  ED MD interpretation:    Official radiology report(s): No results found.  ____________________________________________   PROCEDURES  Procedure(s) performed (including Critical Care):  Procedures   ____________________________________________   INITIAL IMPRESSION / ASSESSMENT AND PLAN / ED COURSE        + Reported shortness of breath - Cardiovascular signs such as hypotension - GI symptoms such as nausea and diarrhea - Cutaneous hives/erythema Airway protected. No uvular or laryngoedema.  Given history and exam, presentation most consistent with anaphylaxis. I have low suspicion for toxic shock syndrome, asthma exacerbation, or drug toxicity. Interventions:  Solumedrol (methylprednisolone) 125mg  IV,  Benadryl 50 IV, Pepcid (famotidine) 20mg  IV  Reassessment: After 3 hours of observation after administration of medications, patient had no concerning return of anaphylaxis signs and symptoms. Rx: EpiPen, Benadryl q8hr x3days Disposition: Discharge home with SRP. Follow up with PCP in 1-2 days.      ____________________________________________   FINAL CLINICAL IMPRESSION(S) / ED DIAGNOSES  Final diagnoses:  None     ED Discharge Orders    None       Note:  This document was prepared using Dragon voice recognition software and may include unintentional dictation errors.   Naaman Plummer, MD 12/06/19 [phone]

## 2019-12-06 NOTE — ED Triage Notes (Addendum)
Pt states shortly after eating cereal and a banana this morning she began noticing her left side of her tongue swelling around 12p today,. Pt having difficulty with speech and states she is unable to control her saliva and feels like it is affecting her airway.

## 2019-12-18 DIAGNOSIS — R195 Other fecal abnormalities: Secondary | ICD-10-CM | POA: Diagnosis not present

## 2019-12-18 DIAGNOSIS — K8681 Exocrine pancreatic insufficiency: Secondary | ICD-10-CM | POA: Diagnosis not present

## 2020-02-12 ENCOUNTER — Other Ambulatory Visit: Payer: Self-pay | Admitting: Family Medicine

## 2020-02-12 DIAGNOSIS — Z13220 Encounter for screening for lipoid disorders: Secondary | ICD-10-CM | POA: Diagnosis not present

## 2020-02-12 DIAGNOSIS — Z0000 Encounter for general adult medical examination without abnormal findings: Secondary | ICD-10-CM | POA: Diagnosis not present

## 2020-02-12 DIAGNOSIS — I10 Essential (primary) hypertension: Secondary | ICD-10-CM | POA: Diagnosis not present

## 2020-02-12 DIAGNOSIS — Z1231 Encounter for screening mammogram for malignant neoplasm of breast: Secondary | ICD-10-CM | POA: Diagnosis not present

## 2020-02-12 DIAGNOSIS — Z23 Encounter for immunization: Secondary | ICD-10-CM | POA: Diagnosis not present

## 2020-02-13 DIAGNOSIS — K8689 Other specified diseases of pancreas: Secondary | ICD-10-CM | POA: Diagnosis not present

## 2020-02-21 ENCOUNTER — Other Ambulatory Visit: Payer: Self-pay | Admitting: Family Medicine

## 2020-04-08 ENCOUNTER — Other Ambulatory Visit: Payer: Self-pay | Admitting: Gastroenterology

## 2020-06-04 DIAGNOSIS — T781XXA Other adverse food reactions, not elsewhere classified, initial encounter: Secondary | ICD-10-CM | POA: Diagnosis not present

## 2020-07-03 DIAGNOSIS — T781XXA Other adverse food reactions, not elsewhere classified, initial encounter: Secondary | ICD-10-CM | POA: Diagnosis not present

## 2020-08-07 ENCOUNTER — Other Ambulatory Visit: Payer: Self-pay

## 2020-08-07 MED FILL — Pancrelipase (Lip-Prot-Amyl) DR Cap 40000-126000-168000 Unit: ORAL | 30 days supply | Qty: 450 | Fill #0 | Status: AC

## 2020-08-07 MED FILL — Omeprazole Cap Delayed Release 20 MG: ORAL | 90 days supply | Qty: 90 | Fill #0 | Status: AC

## 2020-08-07 MED FILL — Atenolol Tab 50 MG: ORAL | 90 days supply | Qty: 90 | Fill #0 | Status: AC

## 2020-08-08 ENCOUNTER — Other Ambulatory Visit: Payer: Self-pay

## 2020-08-08 MED ORDER — DIAZEPAM 5 MG PO TABS
ORAL_TABLET | ORAL | 0 refills | Status: AC
  Filled 2020-08-08: qty 30, 10d supply, fill #0

## 2020-08-09 ENCOUNTER — Other Ambulatory Visit: Payer: Self-pay

## 2020-08-12 DIAGNOSIS — I10 Essential (primary) hypertension: Secondary | ICD-10-CM | POA: Diagnosis not present

## 2020-08-12 DIAGNOSIS — K8689 Other specified diseases of pancreas: Secondary | ICD-10-CM | POA: Diagnosis not present

## 2020-08-12 DIAGNOSIS — M545 Low back pain, unspecified: Secondary | ICD-10-CM | POA: Diagnosis not present

## 2020-08-12 DIAGNOSIS — E7800 Pure hypercholesterolemia, unspecified: Secondary | ICD-10-CM | POA: Diagnosis not present

## 2020-08-12 DIAGNOSIS — Z23 Encounter for immunization: Secondary | ICD-10-CM | POA: Diagnosis not present

## 2020-08-16 ENCOUNTER — Other Ambulatory Visit: Payer: Self-pay

## 2020-08-16 MED ORDER — GABAPENTIN 300 MG PO CAPS
ORAL_CAPSULE | ORAL | 1 refills | Status: AC
  Filled 2020-08-16: qty 180, 90d supply, fill #0

## 2020-08-16 MED ORDER — OMEPRAZOLE 20 MG PO CPDR
DELAYED_RELEASE_CAPSULE | ORAL | 4 refills | Status: DC
  Filled 2020-08-16 – 2021-04-28 (×2): qty 90, 90d supply, fill #0
  Filled 2021-07-28: qty 90, 90d supply, fill #1

## 2020-08-16 MED ORDER — ATENOLOL 50 MG PO TABS
50.0000 mg | ORAL_TABLET | Freq: Every day | ORAL | 4 refills | Status: AC
  Filled 2020-08-16: qty 90, 90d supply, fill #0

## 2020-08-16 MED ORDER — FLUTICASONE PROPIONATE 50 MCG/ACT NA SUSP
NASAL | 4 refills | Status: AC
  Filled 2020-08-16: qty 48, 90d supply, fill #0

## 2020-09-02 ENCOUNTER — Other Ambulatory Visit: Payer: Self-pay

## 2020-09-19 ENCOUNTER — Other Ambulatory Visit: Payer: Self-pay

## 2020-09-19 MED FILL — Atorvastatin Calcium Tab 10 MG (Base Equivalent): ORAL | 90 days supply | Qty: 90 | Fill #0 | Status: AC

## 2020-10-26 IMAGING — CR DG CHEST 2V
1 series · 2 of 2 positions shown · non-contrast
Comparison: Chest radiograph 07/10/2015

CLINICAL DATA: Pt comes via POV from home with c/o nausea, SOB and
weakness. Pt states this all started yesterday evening. Pt states
diarrhea. Pt states it comes in waves. Pt states it is hard to speak
and catch her breath.

EXAM:
CHEST - 2 VIEW

[Series 1: dg chest 2 view · 0.14mm/px · 2 of 2 slices shown]
[im 1/2]
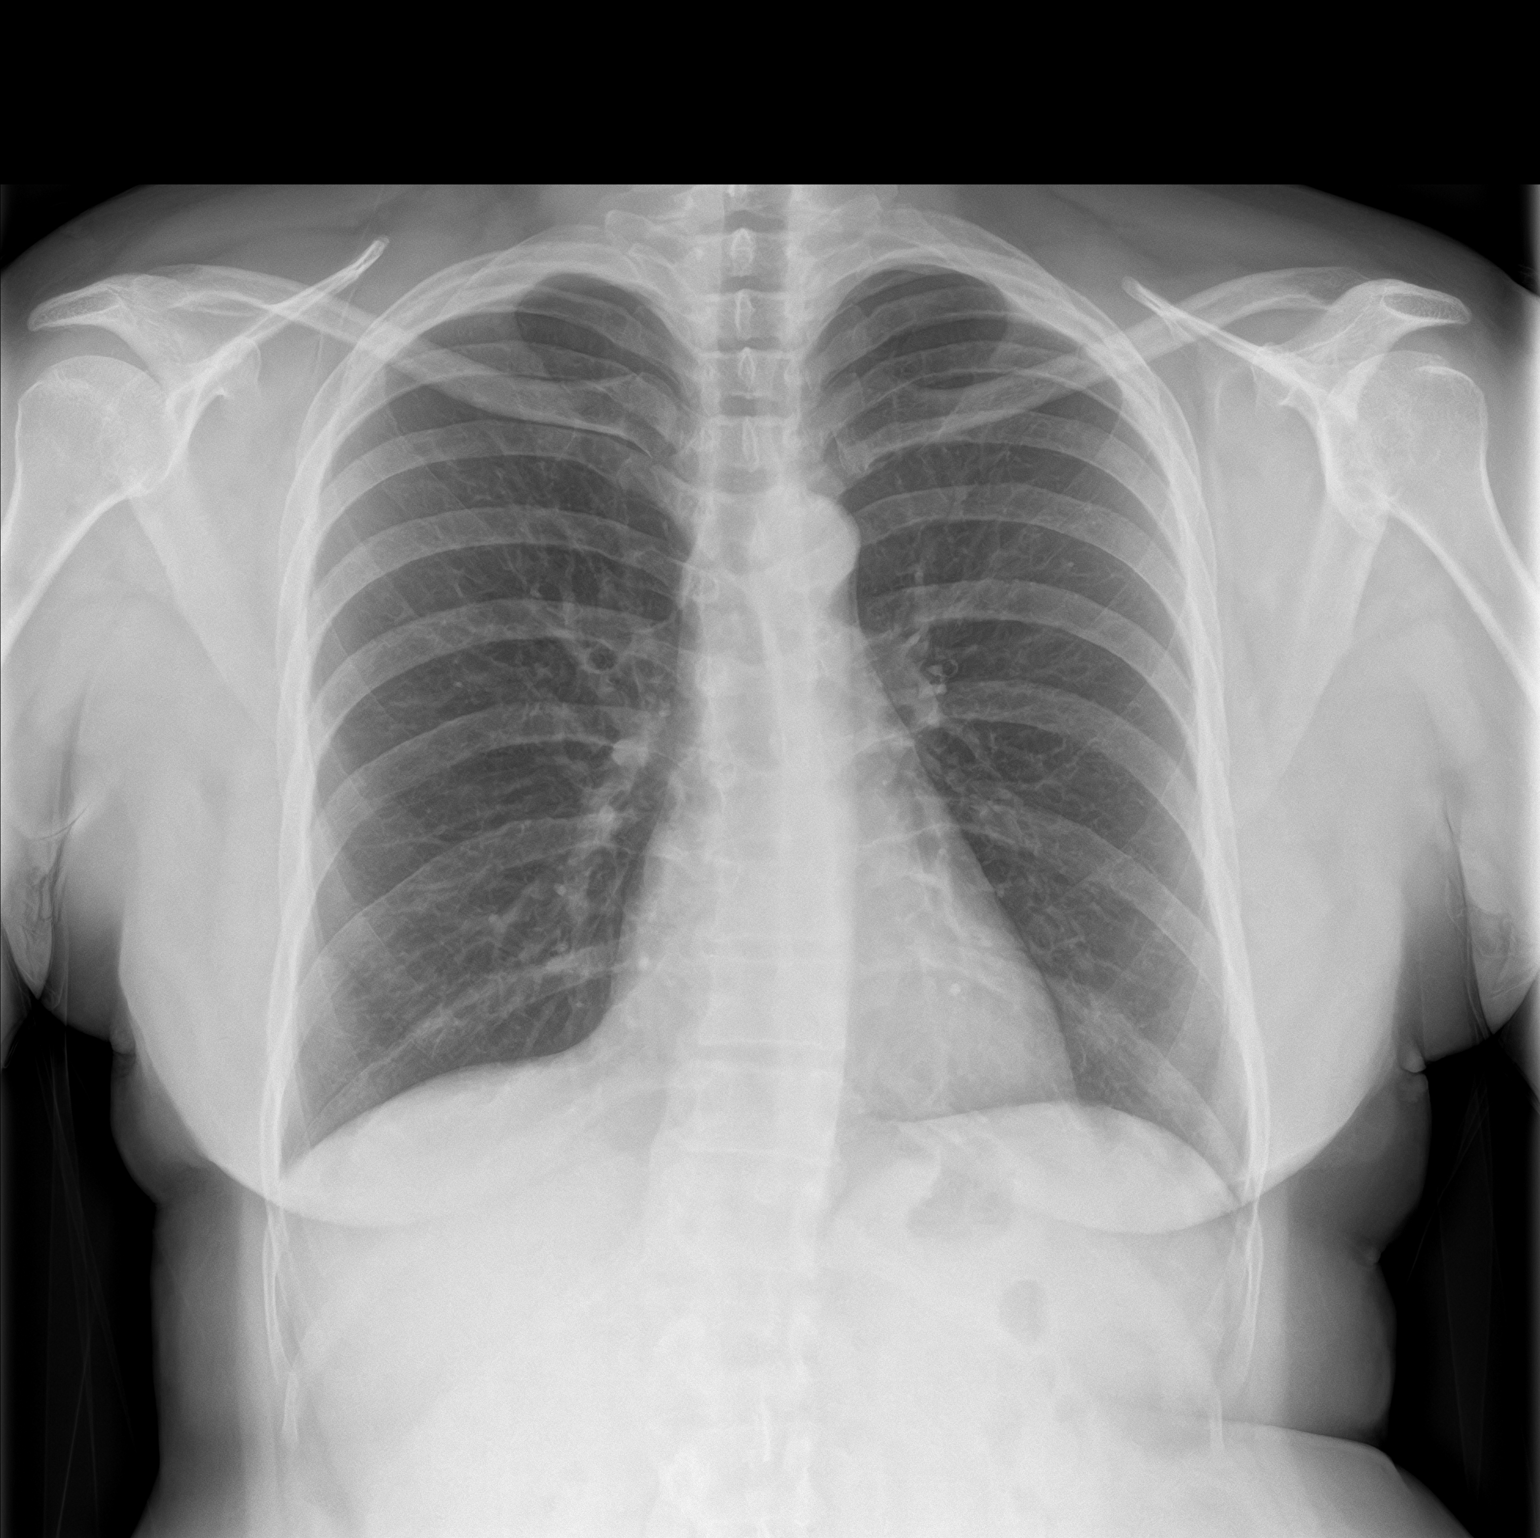
[im 2/2]
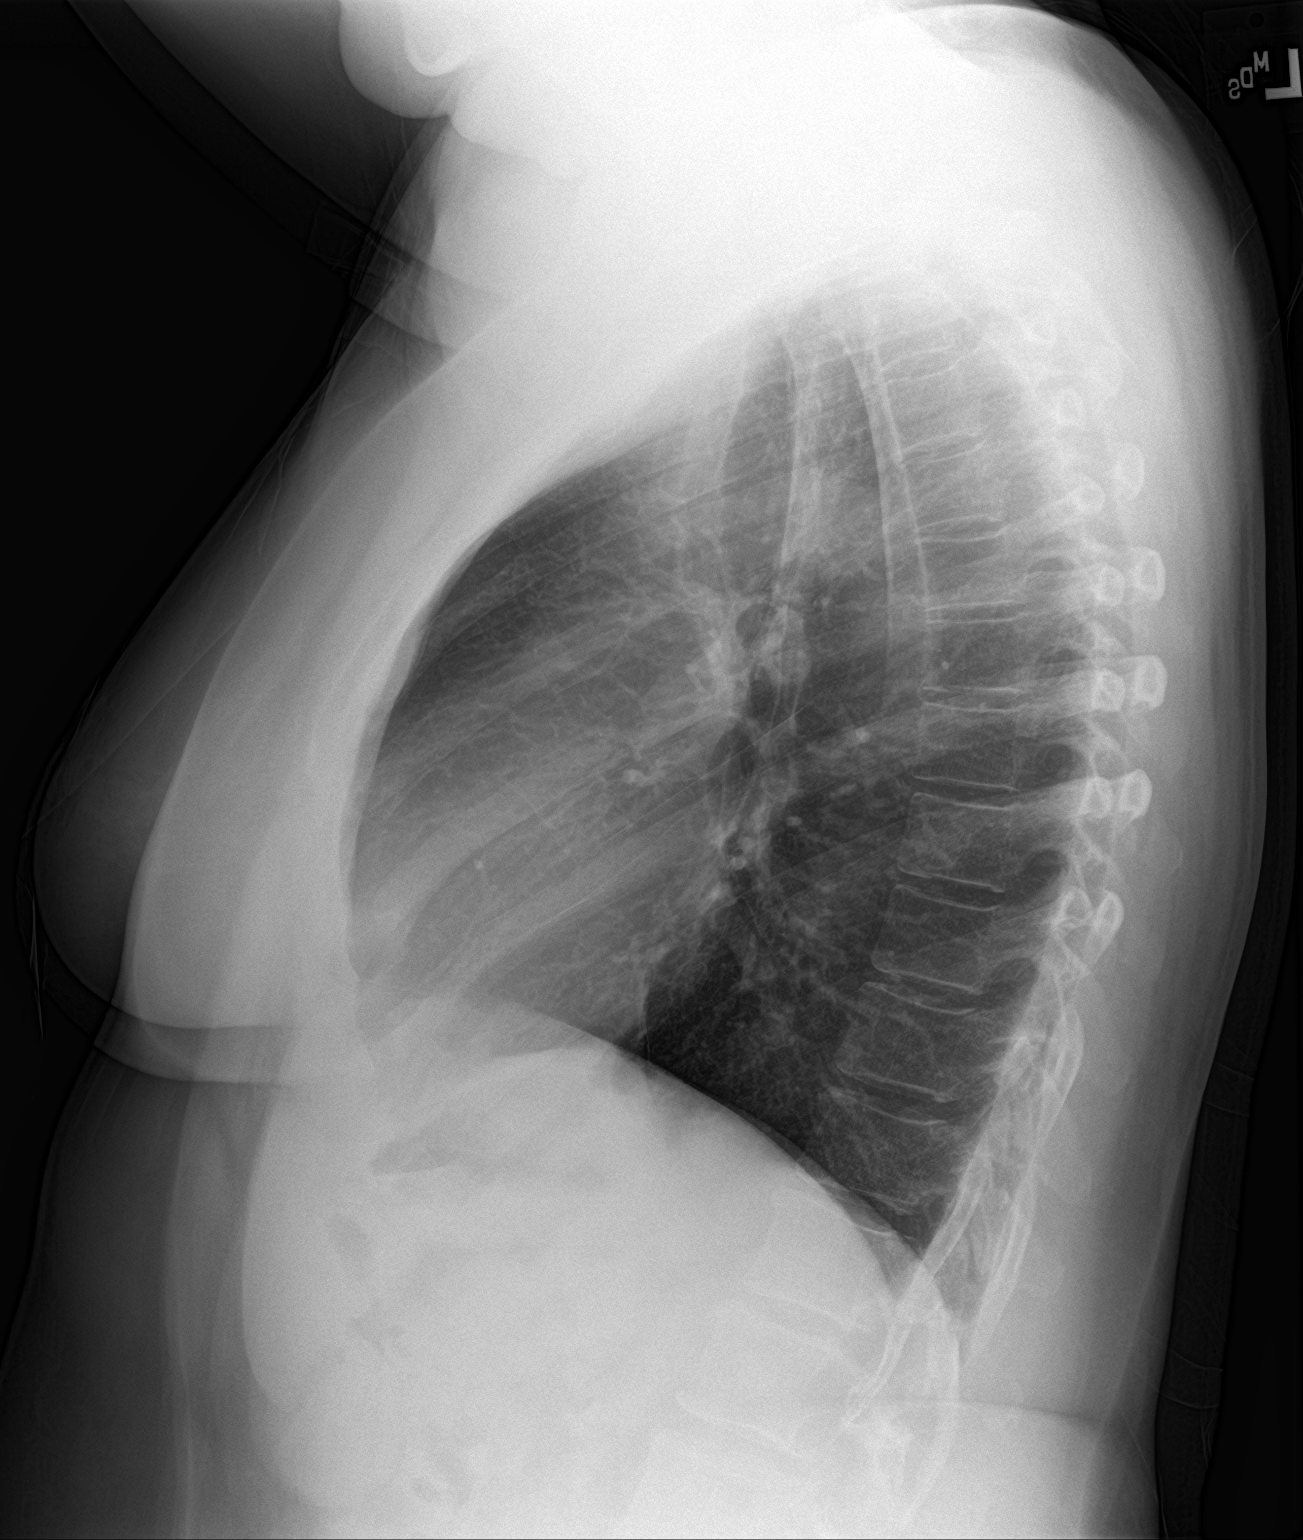

[2 of 2 positions shown; findings below may reference images not displayed]

FINDINGS: The heart size and mediastinal contours are within normal limits.
The lungs are clear. No pneumothorax or pleural effusion. The
visualized skeletal structures are unremarkable.
IMPRESSION: No acute cardiopulmonary process.

## 2020-10-29 ENCOUNTER — Other Ambulatory Visit: Payer: Self-pay

## 2020-10-29 MED FILL — Pancrelipase (Lip-Prot-Amyl) DR Cap 40000-126000-168000 Unit: ORAL | 30 days supply | Qty: 450 | Fill #1 | Status: AC

## 2020-10-31 ENCOUNTER — Other Ambulatory Visit: Payer: Self-pay

## 2020-10-31 MED FILL — Omeprazole Cap Delayed Release 20 MG: ORAL | 90 days supply | Qty: 90 | Fill #1 | Status: AC

## 2020-10-31 MED FILL — Atenolol Tab 50 MG: ORAL | 90 days supply | Qty: 90 | Fill #1 | Status: AC

## 2020-11-01 ENCOUNTER — Other Ambulatory Visit: Payer: Self-pay

## 2021-01-03 ENCOUNTER — Other Ambulatory Visit: Payer: Self-pay

## 2021-01-03 MED FILL — Atorvastatin Calcium Tab 10 MG (Base Equivalent): ORAL | 90 days supply | Qty: 90 | Fill #1 | Status: AC

## 2021-02-05 ENCOUNTER — Other Ambulatory Visit: Payer: Self-pay

## 2021-02-05 MED FILL — Pancrelipase (Lip-Prot-Amyl) DR Cap 40000-126000-168000 Unit: ORAL | 30 days supply | Qty: 450 | Fill #2 | Status: AC

## 2021-02-05 MED FILL — Omeprazole Cap Delayed Release 20 MG: ORAL | 90 days supply | Qty: 90 | Fill #2 | Status: AC

## 2021-02-06 ENCOUNTER — Other Ambulatory Visit: Payer: Self-pay

## 2021-02-07 ENCOUNTER — Other Ambulatory Visit: Payer: Self-pay

## 2021-02-07 MED ORDER — ATENOLOL 50 MG PO TABS
50.0000 mg | ORAL_TABLET | Freq: Every day | ORAL | 1 refills | Status: DC
  Filled 2021-02-07: qty 90, 90d supply, fill #0
  Filled 2021-04-26: qty 90, 90d supply, fill #1

## 2021-02-24 ENCOUNTER — Other Ambulatory Visit: Payer: Self-pay

## 2021-02-24 MED ORDER — ATORVASTATIN CALCIUM 10 MG PO TABS
10.0000 mg | ORAL_TABLET | Freq: Every day | ORAL | 4 refills | Status: DC
  Filled 2021-02-24 – 2021-04-11 (×2): qty 90, 90d supply, fill #0
  Filled 2021-07-21: qty 90, 90d supply, fill #1
  Filled 2021-10-20: qty 90, 90d supply, fill #2
  Filled 2022-01-26: qty 90, 90d supply, fill #3

## 2021-02-24 MED ORDER — DIAZEPAM 5 MG PO TABS
ORAL_TABLET | ORAL | 0 refills | Status: DC
  Filled 2021-02-24: qty 30, 10d supply, fill #0

## 2021-04-11 ENCOUNTER — Other Ambulatory Visit: Payer: Self-pay

## 2021-04-26 ENCOUNTER — Other Ambulatory Visit: Payer: Self-pay

## 2021-04-28 ENCOUNTER — Other Ambulatory Visit: Payer: Self-pay

## 2021-07-21 ENCOUNTER — Other Ambulatory Visit: Payer: Self-pay

## 2021-07-28 ENCOUNTER — Other Ambulatory Visit: Payer: Self-pay

## 2021-07-29 ENCOUNTER — Other Ambulatory Visit: Payer: Self-pay

## 2021-07-30 ENCOUNTER — Other Ambulatory Visit: Payer: Self-pay

## 2021-07-30 MED ORDER — ATENOLOL 50 MG PO TABS
50.0000 mg | ORAL_TABLET | Freq: Every day | ORAL | 0 refills | Status: DC
  Filled 2021-07-30: qty 90, 90d supply, fill #0

## 2021-07-30 MED ORDER — DIAZEPAM 5 MG PO TABS
ORAL_TABLET | ORAL | 0 refills | Status: DC
  Filled 2021-07-30: qty 30, 10d supply, fill #0

## 2021-08-01 ENCOUNTER — Other Ambulatory Visit: Payer: Self-pay

## 2021-08-04 ENCOUNTER — Other Ambulatory Visit: Payer: Self-pay

## 2021-08-06 ENCOUNTER — Other Ambulatory Visit: Payer: Self-pay

## 2021-08-06 MED ORDER — ZENPEP 40000-126000 UNITS PO CPEP
ORAL_CAPSULE | ORAL | 0 refills | Status: DC
  Filled 2021-08-06: qty 300, 30d supply, fill #0

## 2021-08-07 ENCOUNTER — Other Ambulatory Visit: Payer: Self-pay

## 2021-08-08 ENCOUNTER — Other Ambulatory Visit: Payer: Self-pay

## 2021-08-20 ENCOUNTER — Other Ambulatory Visit: Payer: Self-pay

## 2021-08-20 MED ORDER — ZENPEP 40000-126000 UNITS PO CPEP
ORAL_CAPSULE | ORAL | 3 refills | Status: AC
  Filled 2021-08-20: qty 480, 30d supply, fill #0
  Filled 2021-08-20: qty 1500, 90d supply, fill #0
  Filled 2021-08-21: qty 480, 30d supply, fill #0
  Filled 2022-06-13: qty 480, 30d supply, fill #1

## 2021-08-21 ENCOUNTER — Other Ambulatory Visit: Payer: Self-pay

## 2021-08-22 ENCOUNTER — Other Ambulatory Visit: Payer: Self-pay

## 2021-09-14 ENCOUNTER — Other Ambulatory Visit: Payer: Self-pay

## 2021-09-14 MED ORDER — ATENOLOL 50 MG PO TABS
50.0000 mg | ORAL_TABLET | Freq: Every day | ORAL | 3 refills | Status: DC
  Filled 2021-09-14 – 2021-10-20 (×2): qty 90, 90d supply, fill #0
  Filled 2022-01-26: qty 90, 90d supply, fill #1
  Filled 2022-04-24: qty 90, 90d supply, fill #2
  Filled 2022-07-23: qty 90, 90d supply, fill #3

## 2021-10-20 ENCOUNTER — Other Ambulatory Visit: Payer: Self-pay

## 2021-10-21 ENCOUNTER — Other Ambulatory Visit: Payer: Self-pay

## 2021-10-21 MED ORDER — DIAZEPAM 5 MG PO TABS
ORAL_TABLET | ORAL | 0 refills | Status: AC
  Filled 2021-10-21: qty 30, 10d supply, fill #0

## 2021-10-21 MED ORDER — OMEPRAZOLE 20 MG PO CPDR
DELAYED_RELEASE_CAPSULE | ORAL | 2 refills | Status: DC
  Filled 2021-10-21: qty 90, 90d supply, fill #0
  Filled 2022-01-26: qty 90, 90d supply, fill #1
  Filled 2022-04-24: qty 90, 90d supply, fill #2

## 2022-01-26 ENCOUNTER — Other Ambulatory Visit: Payer: Self-pay

## 2022-01-27 ENCOUNTER — Other Ambulatory Visit: Payer: Self-pay

## 2022-01-27 MED ORDER — DIAZEPAM 5 MG PO TABS
ORAL_TABLET | ORAL | 0 refills | Status: DC
  Filled 2022-01-27: qty 30, 10d supply, fill #0

## 2022-03-06 ENCOUNTER — Other Ambulatory Visit: Payer: Self-pay

## 2022-03-06 MED ORDER — FLUTICASONE PROPIONATE 50 MCG/ACT NA SUSP
2.0000 | Freq: Every day | NASAL | 11 refills | Status: AC
  Filled 2022-03-06 – 2022-06-17 (×2): qty 16, 30d supply, fill #0

## 2022-03-13 ENCOUNTER — Other Ambulatory Visit: Payer: Self-pay

## 2022-03-15 ENCOUNTER — Other Ambulatory Visit: Payer: Self-pay

## 2022-03-15 MED ORDER — ATORVASTATIN CALCIUM 10 MG PO TABS
10.0000 mg | ORAL_TABLET | Freq: Every day | ORAL | 4 refills | Status: AC
  Filled 2022-03-15 – 2022-04-24 (×2): qty 90, 90d supply, fill #0
  Filled 2022-07-23: qty 90, 90d supply, fill #1
  Filled 2022-10-22: qty 90, 90d supply, fill #2

## 2022-04-24 ENCOUNTER — Other Ambulatory Visit: Payer: Self-pay

## 2022-04-24 MED ORDER — DIAZEPAM 5 MG PO TABS
5.0000 mg | ORAL_TABLET | Freq: Three times a day (TID) | ORAL | 0 refills | Status: DC | PRN
  Filled 2022-04-24: qty 30, 10d supply, fill #0

## 2022-06-15 ENCOUNTER — Other Ambulatory Visit: Payer: Self-pay

## 2022-06-16 ENCOUNTER — Other Ambulatory Visit: Payer: Self-pay

## 2022-06-17 ENCOUNTER — Other Ambulatory Visit: Payer: Self-pay

## 2022-07-23 ENCOUNTER — Other Ambulatory Visit: Payer: Self-pay

## 2022-07-24 ENCOUNTER — Other Ambulatory Visit: Payer: Self-pay

## 2022-07-24 MED ORDER — DIAZEPAM 5 MG PO TABS
5.0000 mg | ORAL_TABLET | Freq: Three times a day (TID) | ORAL | 0 refills | Status: DC | PRN
  Filled 2022-07-24: qty 30, 10d supply, fill #0

## 2022-07-24 MED ORDER — OMEPRAZOLE 20 MG PO CPDR
20.0000 mg | DELAYED_RELEASE_CAPSULE | Freq: Every day | ORAL | 4 refills | Status: AC
  Filled 2022-07-24: qty 90, 90d supply, fill #0
  Filled 2022-10-22: qty 90, 90d supply, fill #1

## 2022-09-08 ENCOUNTER — Other Ambulatory Visit: Payer: Self-pay

## 2022-09-08 MED ORDER — WEGOVY 0.25 MG/0.5ML ~~LOC~~ SOAJ
0.2500 mg | SUBCUTANEOUS | 2 refills | Status: DC
  Filled 2022-09-08 – 2022-09-28 (×3): qty 2, 28d supply, fill #0

## 2022-09-08 MED ORDER — ATENOLOL 50 MG PO TABS
50.0000 mg | ORAL_TABLET | Freq: Every day | ORAL | 3 refills | Status: AC
  Filled 2022-09-08 – 2022-10-22 (×2): qty 90, 90d supply, fill #0

## 2022-09-28 ENCOUNTER — Other Ambulatory Visit: Payer: Self-pay

## 2022-10-05 ENCOUNTER — Other Ambulatory Visit: Payer: Self-pay

## 2022-10-22 ENCOUNTER — Other Ambulatory Visit: Payer: Self-pay

## 2022-10-23 ENCOUNTER — Other Ambulatory Visit: Payer: Self-pay

## 2022-10-25 ENCOUNTER — Other Ambulatory Visit: Payer: Self-pay

## 2022-10-25 MED ORDER — WEGOVY 0.5 MG/0.5ML ~~LOC~~ SOAJ
0.5000 mg | SUBCUTANEOUS | 1 refills | Status: DC
  Filled 2022-10-25 – 2022-10-28 (×2): qty 2, 28d supply, fill #0
  Filled 2022-11-25: qty 2, 28d supply, fill #1

## 2022-10-28 ENCOUNTER — Other Ambulatory Visit: Payer: Self-pay

## 2022-10-28 MED ORDER — DIAZEPAM 5 MG PO TABS
5.0000 mg | ORAL_TABLET | Freq: Three times a day (TID) | ORAL | 0 refills | Status: AC | PRN
  Filled 2022-10-28: qty 30, 10d supply, fill #0

## 2022-10-29 ENCOUNTER — Other Ambulatory Visit: Payer: Self-pay

## 2022-11-25 ENCOUNTER — Other Ambulatory Visit: Payer: Self-pay

## 2022-12-01 ENCOUNTER — Other Ambulatory Visit: Payer: Self-pay

## 2022-12-03 ENCOUNTER — Other Ambulatory Visit: Payer: Self-pay

## 2022-12-08 ENCOUNTER — Other Ambulatory Visit: Payer: Self-pay

## 2022-12-30 ENCOUNTER — Other Ambulatory Visit: Payer: Self-pay

## 2023-01-25 ENCOUNTER — Other Ambulatory Visit: Payer: Self-pay

## 2023-01-25 MED ORDER — ATORVASTATIN CALCIUM 10 MG PO TABS
10.0000 mg | ORAL_TABLET | Freq: Every day | ORAL | 4 refills | Status: DC
  Filled 2023-01-25: qty 90, 90d supply, fill #0
  Filled 2023-04-22: qty 90, 90d supply, fill #1
  Filled 2023-07-19: qty 90, 90d supply, fill #2
  Filled 2023-11-17: qty 90, 90d supply, fill #3

## 2023-01-25 MED ORDER — WEGOVY 0.5 MG/0.5ML ~~LOC~~ SOAJ
0.5000 mg | SUBCUTANEOUS | 3 refills | Status: DC
Start: 2023-01-25 — End: 2023-02-09
  Filled 2023-01-25 – 2023-02-08 (×4): qty 2, 28d supply, fill #0

## 2023-01-25 MED ORDER — OMEPRAZOLE 20 MG PO CPDR
20.0000 mg | DELAYED_RELEASE_CAPSULE | Freq: Every day | ORAL | 4 refills | Status: DC
  Filled 2023-01-25: qty 90, 90d supply, fill #0
  Filled 2023-04-22: qty 90, 90d supply, fill #1
  Filled 2023-07-19: qty 90, 90d supply, fill #2
  Filled 2023-10-21: qty 90, 90d supply, fill #3
  Filled 2024-01-11: qty 90, 90d supply, fill #4

## 2023-01-25 MED ORDER — DIAZEPAM 5 MG PO TABS
5.0000 mg | ORAL_TABLET | Freq: Every day | ORAL | 0 refills | Status: AC
  Filled 2023-01-25: qty 30, 30d supply, fill #0

## 2023-01-25 MED ORDER — ATENOLOL 50 MG PO TABS
50.0000 mg | ORAL_TABLET | Freq: Every day | ORAL | 3 refills | Status: DC
  Filled 2023-01-25: qty 90, 90d supply, fill #0
  Filled 2023-04-22: qty 90, 90d supply, fill #1
  Filled 2023-07-19: qty 90, 90d supply, fill #2
  Filled 2023-10-21: qty 90, 90d supply, fill #3

## 2023-01-28 ENCOUNTER — Other Ambulatory Visit: Payer: Self-pay

## 2023-01-29 ENCOUNTER — Other Ambulatory Visit: Payer: Self-pay

## 2023-02-04 ENCOUNTER — Other Ambulatory Visit: Payer: Self-pay

## 2023-02-08 ENCOUNTER — Other Ambulatory Visit: Payer: Self-pay

## 2023-02-09 ENCOUNTER — Other Ambulatory Visit: Payer: Self-pay

## 2023-02-09 MED ORDER — WEGOVY 1 MG/0.5ML ~~LOC~~ SOAJ
1.0000 mg | SUBCUTANEOUS | 5 refills | Status: DC
Start: 2023-02-09 — End: 2023-03-17
  Filled 2023-02-09 – 2023-02-11 (×3): qty 2, 28d supply, fill #0

## 2023-02-10 ENCOUNTER — Other Ambulatory Visit: Payer: Self-pay

## 2023-02-11 ENCOUNTER — Other Ambulatory Visit: Payer: Self-pay

## 2023-03-17 ENCOUNTER — Other Ambulatory Visit: Payer: Self-pay

## 2023-03-17 MED ORDER — DIAZEPAM 5 MG PO TABS
5.0000 mg | ORAL_TABLET | Freq: Every day | ORAL | 0 refills | Status: AC | PRN
  Filled 2023-03-17: qty 30, 30d supply, fill #0

## 2023-03-17 MED ORDER — TRIAMCINOLONE ACETONIDE 0.1 % EX CREA
1.0000 | TOPICAL_CREAM | Freq: Two times a day (BID) | CUTANEOUS | 0 refills | Status: AC
  Filled 2023-03-17: qty 30, 30d supply, fill #0

## 2023-03-17 MED ORDER — FLUTICASONE PROPIONATE 50 MCG/ACT NA SUSP
2.0000 | Freq: Every day | NASAL | 11 refills | Status: DC
  Filled 2023-03-17: qty 16, 30d supply, fill #0
  Filled 2024-01-12: qty 16, 30d supply, fill #1

## 2023-03-17 MED ORDER — WEGOVY 0.25 MG/0.5ML ~~LOC~~ SOAJ
0.2500 mg | SUBCUTANEOUS | 5 refills | Status: DC
Start: 2023-03-17 — End: 2023-06-18
  Filled 2023-03-17: qty 2, 28d supply, fill #0

## 2023-04-16 ENCOUNTER — Other Ambulatory Visit: Payer: Self-pay

## 2023-04-22 ENCOUNTER — Other Ambulatory Visit: Payer: Self-pay

## 2023-05-17 ENCOUNTER — Other Ambulatory Visit: Payer: Self-pay

## 2023-05-18 ENCOUNTER — Other Ambulatory Visit: Payer: Self-pay

## 2023-05-19 ENCOUNTER — Other Ambulatory Visit: Payer: Self-pay

## 2023-05-20 ENCOUNTER — Other Ambulatory Visit: Payer: Self-pay

## 2023-05-24 ENCOUNTER — Other Ambulatory Visit: Payer: Self-pay

## 2023-05-24 MED ORDER — ZENPEP 40000-126000 UNITS PO CPEP
ORAL_CAPSULE | ORAL | 3 refills | Status: AC
Start: 2022-12-09 — End: ?
  Filled 2023-05-24: qty 1500, 90d supply, fill #0
  Filled 2023-05-25 – 2023-06-04 (×9): qty 1500, 84d supply, fill #0
  Filled 2023-07-19: qty 1500, 90d supply, fill #0

## 2023-05-25 ENCOUNTER — Other Ambulatory Visit: Payer: Self-pay

## 2023-05-26 ENCOUNTER — Other Ambulatory Visit: Payer: Self-pay

## 2023-05-27 ENCOUNTER — Other Ambulatory Visit: Payer: Self-pay

## 2023-05-31 ENCOUNTER — Other Ambulatory Visit: Payer: Self-pay

## 2023-06-03 ENCOUNTER — Other Ambulatory Visit: Payer: Self-pay

## 2023-06-04 ENCOUNTER — Other Ambulatory Visit: Payer: Self-pay

## 2023-06-18 ENCOUNTER — Other Ambulatory Visit: Payer: Self-pay

## 2023-06-18 MED ORDER — ZENPEP 40000-126000 UNITS PO CPEP
ORAL_CAPSULE | ORAL | 3 refills | Status: AC
  Filled 2023-06-18 – 2023-07-01 (×5): qty 1500, 84d supply, fill #0
  Filled 2024-03-14: qty 1500, 90d supply, fill #0

## 2023-06-21 ENCOUNTER — Other Ambulatory Visit: Payer: Self-pay

## 2023-06-21 MED ORDER — ZENPEP 40000-126000 UNITS PO CPEP
ORAL_CAPSULE | ORAL | 3 refills | Status: AC
  Filled 2023-06-21 – 2023-06-23 (×2): qty 540, 30d supply, fill #0
  Filled 2023-06-28 – 2023-07-05 (×4): qty 1500, 84d supply, fill #0

## 2023-06-23 ENCOUNTER — Other Ambulatory Visit: Payer: Self-pay

## 2023-06-28 ENCOUNTER — Other Ambulatory Visit: Payer: Self-pay

## 2023-06-30 ENCOUNTER — Other Ambulatory Visit: Payer: Self-pay

## 2023-07-01 ENCOUNTER — Other Ambulatory Visit: Payer: Self-pay

## 2023-07-05 ENCOUNTER — Other Ambulatory Visit: Payer: Self-pay

## 2023-07-09 ENCOUNTER — Other Ambulatory Visit: Payer: Self-pay

## 2023-07-19 ENCOUNTER — Other Ambulatory Visit: Payer: Self-pay

## 2023-07-19 MED ORDER — DIAZEPAM 5 MG PO TABS
5.0000 mg | ORAL_TABLET | Freq: Every day | ORAL | 0 refills | Status: DC | PRN
  Filled 2023-07-19: qty 30, 30d supply, fill #0

## 2023-07-20 ENCOUNTER — Other Ambulatory Visit: Payer: Self-pay

## 2023-09-08 ENCOUNTER — Other Ambulatory Visit: Payer: Self-pay

## 2023-09-09 ENCOUNTER — Other Ambulatory Visit: Payer: Self-pay

## 2023-09-10 ENCOUNTER — Other Ambulatory Visit: Payer: Self-pay

## 2023-09-11 ENCOUNTER — Other Ambulatory Visit: Payer: Self-pay

## 2023-09-13 ENCOUNTER — Other Ambulatory Visit: Payer: Self-pay

## 2023-09-13 MED ORDER — DIAZEPAM 5 MG PO TABS
5.0000 mg | ORAL_TABLET | Freq: Every day | ORAL | 0 refills | Status: DC | PRN
  Filled 2023-09-13: qty 30, 30d supply, fill #0

## 2023-11-16 ENCOUNTER — Ambulatory Visit: Admitting: Podiatry

## 2023-11-16 DIAGNOSIS — L600 Ingrowing nail: Secondary | ICD-10-CM | POA: Diagnosis not present

## 2023-11-16 NOTE — Progress Notes (Signed)
 Subjective:  Patient ID: Mariah Christian, female    DOB: 06-08-1960,  MRN: [id number]  Chief Complaint  Patient presents with   Ingrown Toenail    Right hallux ingrown nail     63 y.o. female presents with the above complaint.  Patient presents with right hallux medial border ingrown painful to touch has progressed gotten worse worse with ambulation or shoe pressure patient would like to have removed has not seen MRIs prior to seeing me denies any other acute complaints.   Review of Systems: Negative except as noted in the HPI. Denies N/V/F/Ch.  Past Medical History:  Diagnosis Date   Hypertension     Current Outpatient Medications:    aspirin  81 MG chewable tablet, Chew 81 mg by mouth daily., Disp: , Rfl:    atenolol  (TENORMIN ) 50 MG tablet, Take by mouth., Disp: , Rfl:    atenolol  (TENORMIN ) 50 MG tablet, TAKE 1 TABLET BY MOUTH ONCE DAILY, Disp: 90 tablet, Rfl: 3   atenolol  (TENORMIN ) 50 MG tablet, Take 1 tablet (50 mg total) by mouth once daily, Disp: 90 tablet, Rfl: 4   atenolol  (TENORMIN ) 50 MG tablet, Take 1 tablet (50 mg total) by mouth daily., Disp: 90 tablet, Rfl: 3   atenolol  (TENORMIN ) 50 MG tablet, Take 1 tablet (50 mg total) by mouth once daily, Disp: 90 tablet, Rfl: 3   atorvastatin  (LIPITOR) 10 MG tablet, Take 1 tablet (10 mg total) by mouth once daily, Disp: 90 tablet, Rfl: 4   atorvastatin  (LIPITOR) 10 MG tablet, Take 1 tablet (10 mg total) by mouth once daily, Disp: 90 tablet, Rfl: 4   diazepam  (VALIUM ) 5 MG tablet, Take 1 tablet by mouth every 6 (six) hours as needed., Disp: , Rfl:    diazepam  (VALIUM ) 5 MG tablet, Take 1 tablet (5 mg total) by mouth every 8 (eight) hours as needed (muscle spasm), Disp: 30 tablet, Rfl: 0   diazepam  (VALIUM ) 5 MG tablet, Take 1 tablet (5 mg total) by mouth every 8 (eight) hours as needed (muscle spasm), Disp: 30 tablet, Rfl: 0   diazepam  (VALIUM ) 5 MG tablet, Take 1 tablet (5 mg total) by mouth every 8 (eight) hours as needed for  muscle spasms., Disp: 30 tablet, Rfl: 0   diazepam  (VALIUM ) 5 MG tablet, Take 1 tablet (5 mg total) by mouth once daily as needed for anxiety., Disp: 30 tablet, Rfl: 0   diazepam  (VALIUM ) 5 MG tablet, Take 1 tablet (5 mg total) by mouth daily as needed for anxiety, Disp: 30 tablet, Rfl: 0   diazepam  (VALIUM ) 5 MG tablet, Take 1 tablet (5 mg total) by mouth daily as needed for anxiety, Disp: 30 tablet, Rfl: 0   EPINEPHrine  (EPIPEN  2-PAK) 0.3 mg/0.3 mL IJ SOAJ injection, Inject 0.3 mLs (0.3 mg total) into the muscle as needed for anaphylaxis., Disp: 1 each, Rfl: 1   famotidine  (PEPCID ) 20 MG tablet, Take 1 tablet (20 mg total) by mouth 2 (two) times daily., Disp: 8 tablet, Rfl: 0   fluticasone  (FLONASE ) 50 MCG/ACT nasal spray, Place into the nose., Disp: , Rfl:    fluticasone  (FLONASE ) 50 MCG/ACT nasal spray, PLACE 2 SPRAYS INTO BOTH NOSTRILS ONCE DAILY, Disp: 48 g, Rfl: 4   fluticasone  (FLONASE ) 50 MCG/ACT nasal spray, Place 2 sprays into both nostrils once daily, Disp: 48 g, Rfl: 4   fluticasone  (FLONASE ) 50 MCG/ACT nasal spray, Place 2 sprays into both nostrils daily., Disp: 16 g, Rfl: 11   fluticasone  (FLONASE ) 50 MCG/ACT nasal  spray, Place 2 sprays into both nostrils daily., Disp: 16 g, Rfl: 11   gabapentin  (NEURONTIN ) 300 MG capsule, Take by mouth., Disp: , Rfl:    gabapentin  (NEURONTIN ) 300 MG capsule, TAKE 1 CAPSULE BY MOUTH 2 TIMES DAILY AS NEEDED, Disp: 180 capsule, Rfl: 1   gabapentin  (NEURONTIN ) 300 MG capsule, Take 1 capsule (300 mg total) by mouth 2 (two) times daily as needed, Disp: 180 capsule, Rfl: 1   Ledipasvir -Sofosbuvir  (HARVONI ) 90-400 MG TABS, Take 1 tablet by mouth daily., Disp: 28 tablet, Rfl: 2   Multiple Vitamins-Minerals (MULTIVITAMIN WITH MINERALS) tablet, Take 1 tablet by mouth daily., Disp: , Rfl:    naproxen  (NAPROSYN ) 500 MG tablet, Take 1 tablet (500 mg total) by mouth 2 (two) times daily. (Patient taking differently: Take 500 mg by mouth as needed. ), Disp: 30 tablet,  Rfl: 0   omeprazole  (PRILOSEC) 20 MG capsule, TAKE 1 CAPSULE BY MOUTH ONCE DAILY, Disp: 90 capsule, Rfl: 4   omeprazole  (PRILOSEC) 20 MG capsule, Take 1 capsule (20 mg total) by mouth daily., Disp: 90 capsule, Rfl: 4   omeprazole  (PRILOSEC) 20 MG capsule, Take 1 capsule (20 mg total) by mouth once daily, Disp: 90 capsule, Rfl: 4   Pancrelipase , Lip-Prot-Amyl, (ZENPEP ) 40000-126000 units CPEP, Take up to 4 per meal and 2 per snack, Disp: 1500 capsule, Rfl: 3   Pancrelipase , Lip-Prot-Amyl, (ZENPEP ) 40000-126000 units CPEP, Take 4 capsules (160,000 Units total) by mouth 3 (three) times daily with meals AND 2 capsules (80,000 Units total) with snacks., Disp: 1500 capsule, Rfl: 3   Pancrelipase , Lip-Prot-Amyl, (ZENPEP ) 40000-126000 units CPEP, Take 4 capsules (160,000 Units total) by mouth 3 (three) times daily with meals AND 2 capsules (80,000 Units total) with snacks., Disp: 1500 capsule, Rfl: 3   Pancrelipase , Lip-Prot-Amyl, (ZENPEP ) 40000-126000 units CPEP, Take up to 4 capsules per meal and 2 per snack. May take up to 18/day, Disp: 1500 capsule, Rfl: 3   predniSONE  (DELTASONE ) 20 MG tablet, 3 tablets PO qd x 4 days, Disp: 12 tablet, Rfl: 0   triamcinolone  cream (KENALOG ) 0.1 %, Apply 1 Application topically 2 (two) times daily., Disp: 30 g, Rfl: 0  Social History   Tobacco Use  Smoking Status Former  Smokeless Tobacco Never    Allergies  Allergen Reactions   Doxycycline Shortness Of Breath   Objective:  There were no vitals filed for this visit. There is no height or weight on file to calculate BMI. Constitutional Well developed. Well nourished.  Vascular Dorsalis pedis pulses palpable bilaterally. Posterior tibial pulses palpable bilaterally. Capillary refill normal to all digits.  No cyanosis or clubbing noted. Pedal hair growth normal.  Neurologic Normal speech. Oriented to person, place, and time. Epicritic sensation to light touch grossly present bilaterally.  Dermatologic  Painful ingrowing nail at medial nail borders of the hallux nail right. No other open wounds. No skin lesions.  Orthopedic: Normal joint ROM without pain or crepitus bilaterally. No visible deformities. No bony tenderness.   Radiographs: None Assessment:   1. Ingrown toenail of right foot    Plan:  Patient was evaluated and treated and all questions answered.  Ingrown Nail, right -Patient elects to proceed with minor surgery to remove ingrown toenail removal today. Consent reviewed and signed by patient. -Ingrown nail excised. See procedure note. -Educated on post-procedure care including soaking. Written instructions provided and reviewed. -Patient to follow up in 2 weeks for nail check.  Procedure: Excision of Ingrown Toenail Location: Right 1st toe medial nail borders.  Anesthesia: Lidocaine 1% plain; 1.5 mL and Marcaine 0.5% plain; 1.5 mL, digital block. Skin Prep: Betadine. Dressing: Silvadene; telfa; dry, sterile, compression dressing. Technique: Following skin prep, the toe was exsanguinated and a tourniquet was secured at the base of the toe. The affected nail border was freed, split with a nail splitter, and excised. Chemical matrixectomy was then performed with phenol and irrigated out with alcohol. The tourniquet was then removed and sterile dressing applied. Disposition: Patient tolerated procedure well. Patient to return in 2 weeks for follow-up.   No follow-ups on file.

## 2023-11-16 NOTE — Patient Instructions (Signed)

## 2023-11-17 ENCOUNTER — Other Ambulatory Visit: Payer: Self-pay

## 2023-11-18 ENCOUNTER — Other Ambulatory Visit: Payer: Self-pay

## 2023-11-18 MED ORDER — DIAZEPAM 5 MG PO TABS
5.0000 mg | ORAL_TABLET | Freq: Every day | ORAL | 0 refills | Status: DC | PRN
  Filled 2023-11-18: qty 30, 30d supply, fill #0

## 2024-01-11 ENCOUNTER — Other Ambulatory Visit: Payer: Self-pay

## 2024-01-12 ENCOUNTER — Other Ambulatory Visit: Payer: Self-pay

## 2024-01-12 MED ORDER — DIAZEPAM 5 MG PO TABS
5.0000 mg | ORAL_TABLET | Freq: Every day | ORAL | 0 refills | Status: AC | PRN
Start: 2024-01-12 — End: ?
  Filled 2024-01-12: qty 30, 30d supply, fill #0

## 2024-01-12 MED ORDER — ATENOLOL 50 MG PO TABS
50.0000 mg | ORAL_TABLET | Freq: Every day | ORAL | 0 refills | Status: DC
  Filled 2024-01-12: qty 90, 90d supply, fill #0

## 2024-01-12 MED ORDER — FLUTICASONE PROPIONATE 50 MCG/ACT NA SUSP
2.0000 | Freq: Every day | NASAL | 6 refills | Status: AC
  Filled 2024-01-12: qty 16, 30d supply, fill #0

## 2024-01-12 MED ORDER — TRIAMCINOLONE ACETONIDE 0.1 % EX CREA
1.0000 | TOPICAL_CREAM | Freq: Two times a day (BID) | CUTANEOUS | 3 refills | Status: AC
  Filled 2024-01-12: qty 30, 15d supply, fill #0

## 2024-01-13 ENCOUNTER — Other Ambulatory Visit: Payer: Self-pay

## 2024-03-13 ENCOUNTER — Other Ambulatory Visit: Payer: Self-pay

## 2024-03-13 MED ORDER — MELOXICAM 15 MG PO TABS
15.0000 mg | ORAL_TABLET | Freq: Every day | ORAL | 0 refills | Status: AC
  Filled 2024-03-13: qty 30, 30d supply, fill #0

## 2024-03-14 ENCOUNTER — Other Ambulatory Visit: Payer: Self-pay

## 2024-03-15 ENCOUNTER — Other Ambulatory Visit: Payer: Self-pay

## 2024-03-16 ENCOUNTER — Other Ambulatory Visit: Payer: Self-pay

## 2024-03-16 MED ORDER — ZENPEP 40000-126000 UNITS PO CPEP
ORAL_CAPSULE | ORAL | 3 refills | Status: AC
  Filled 2024-03-16: qty 1300, 92d supply, fill #0
  Filled 2024-03-16: qty 1500, 90d supply, fill #0

## 2024-03-16 MED ORDER — ATORVASTATIN CALCIUM 10 MG PO TABS
10.0000 mg | ORAL_TABLET | Freq: Every day | ORAL | 4 refills | Status: AC
  Filled 2024-03-16: qty 90, 90d supply, fill #0

## 2024-03-16 MED ORDER — DIAZEPAM 5 MG PO TABS
5.0000 mg | ORAL_TABLET | Freq: Every day | ORAL | 0 refills | Status: AC | PRN
  Filled 2024-03-16: qty 30, 30d supply, fill #0

## 2024-03-21 ENCOUNTER — Other Ambulatory Visit: Payer: Self-pay

## 2024-03-21 MED ORDER — OMEPRAZOLE 20 MG PO CPDR
20.0000 mg | DELAYED_RELEASE_CAPSULE | Freq: Every day | ORAL | 4 refills | Status: AC
  Filled 2024-03-21 – 2024-04-19 (×2): qty 90, 90d supply, fill #0

## 2024-03-21 MED ORDER — ATENOLOL 50 MG PO TABS
50.0000 mg | ORAL_TABLET | Freq: Every day | ORAL | 4 refills | Status: AC
  Filled 2024-03-21 – 2024-04-19 (×2): qty 90, 90d supply, fill #0

## 2024-04-04 ENCOUNTER — Other Ambulatory Visit: Payer: Self-pay

## 2024-04-10 ENCOUNTER — Other Ambulatory Visit: Payer: Self-pay

## 2024-04-10 MED ORDER — AMOXICILLIN 500 MG PO CAPS
500.0000 mg | ORAL_CAPSULE | Freq: Four times a day (QID) | ORAL | 0 refills | Status: AC
  Filled 2024-04-10: qty 28, 7d supply, fill #0

## 2024-04-19 ENCOUNTER — Other Ambulatory Visit: Payer: Self-pay

## 2024-04-21 ENCOUNTER — Other Ambulatory Visit: Payer: Self-pay

## 2024-04-21 MED ORDER — FLUTICASONE PROPIONATE 50 MCG/ACT NA SUSP
2.0000 | Freq: Every day | NASAL | 11 refills | Status: AC
  Filled 2024-04-21: qty 16, 30d supply, fill #0

## 2024-05-05 ENCOUNTER — Other Ambulatory Visit: Payer: Self-pay
